# Patient Record
Sex: Male | Born: 1940 | Race: White | Hispanic: No | Marital: Married
Health system: Midwestern US, Community
[De-identification: ages and names within clinical notes are randomized; demographics above are authoritative.]

## PROBLEM LIST (undated history)

## (undated) DIAGNOSIS — N281 Cyst of kidney, acquired: Principal | ICD-10-CM

## (undated) DIAGNOSIS — Z8546 Personal history of malignant neoplasm of prostate: Secondary | ICD-10-CM

## (undated) DIAGNOSIS — R002 Palpitations: Principal | ICD-10-CM

---

## 2019-01-21 IMAGING — OT DXA BONE DENSITY (DR. [PERSON_NAME] PROTOCOL)
2 series · 2 of 2 positions shown · non-contrast
Comparison: none

REASON FOR EXAM: Osteoporosis screening

RISK FACTORS:  Hormone deprivation therapy for prostate cancer.
PRIOR EXAMS:  None
METHOD:  Scans of the spine and hip were performed using dual energy X-ray densitometry (DXA) with the Hologic Discovery C ([id number]) system.

[Series 1: — · 1 of 1 slices shown (1 of 2)]
[im 1/1]
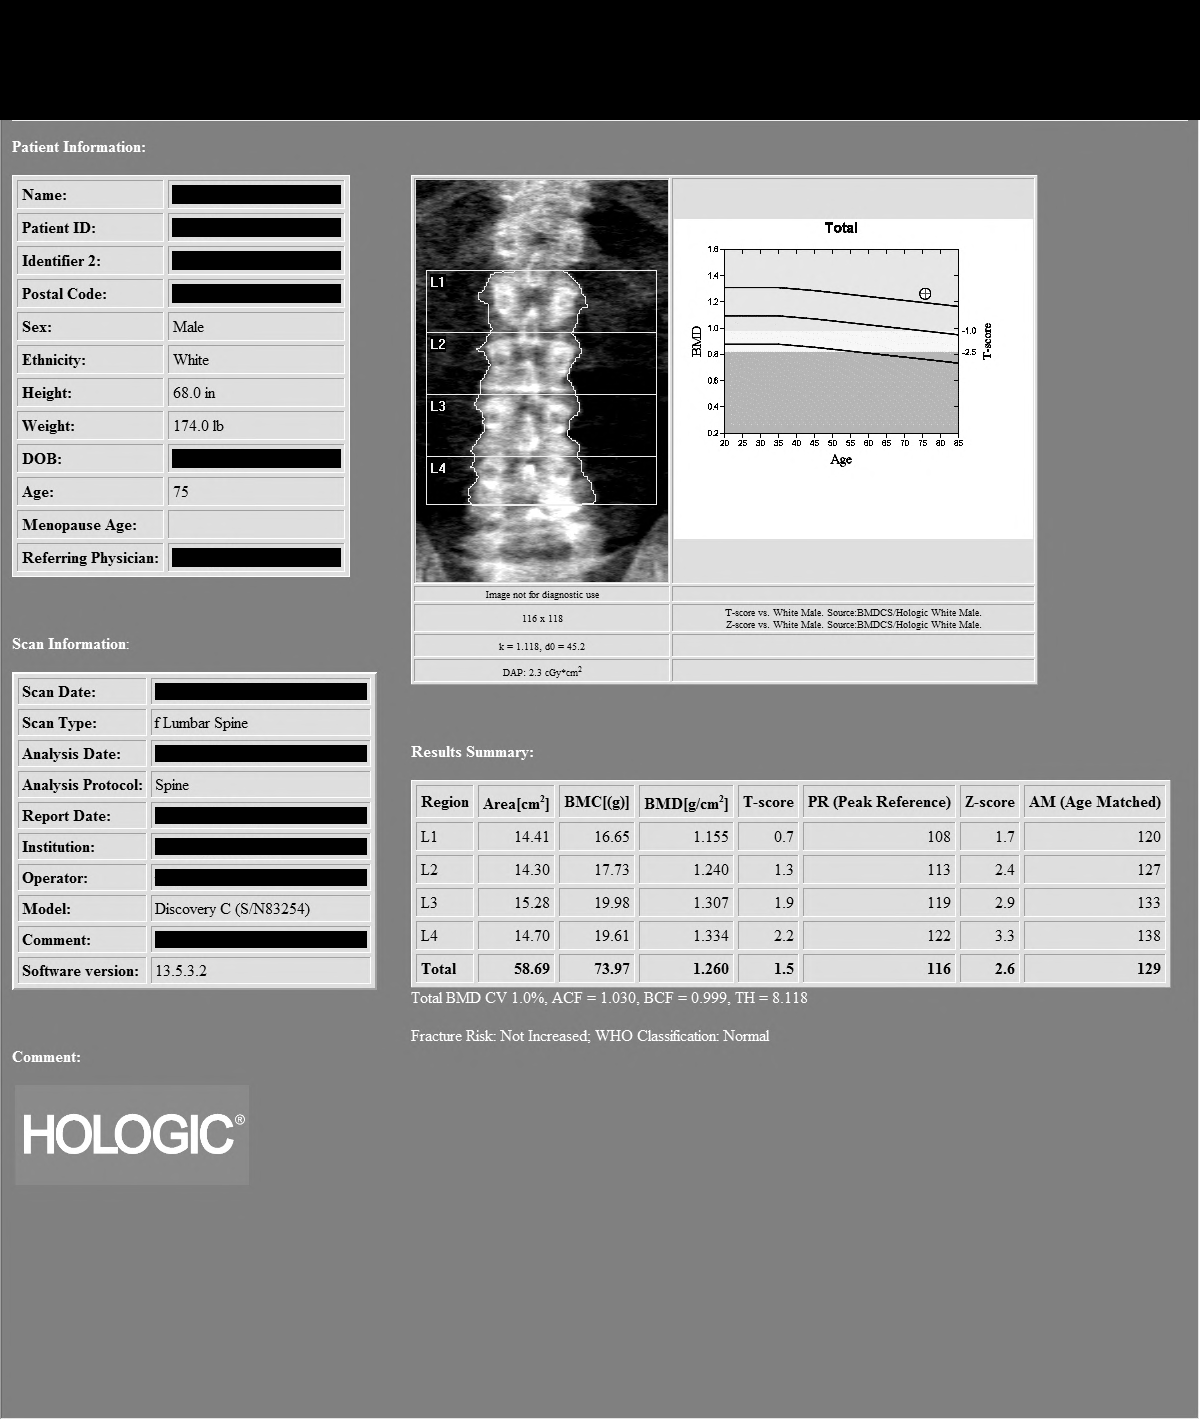

[Series 2: — · left · 1 of 1 slices shown (2 of 2)]
[im 1/1]
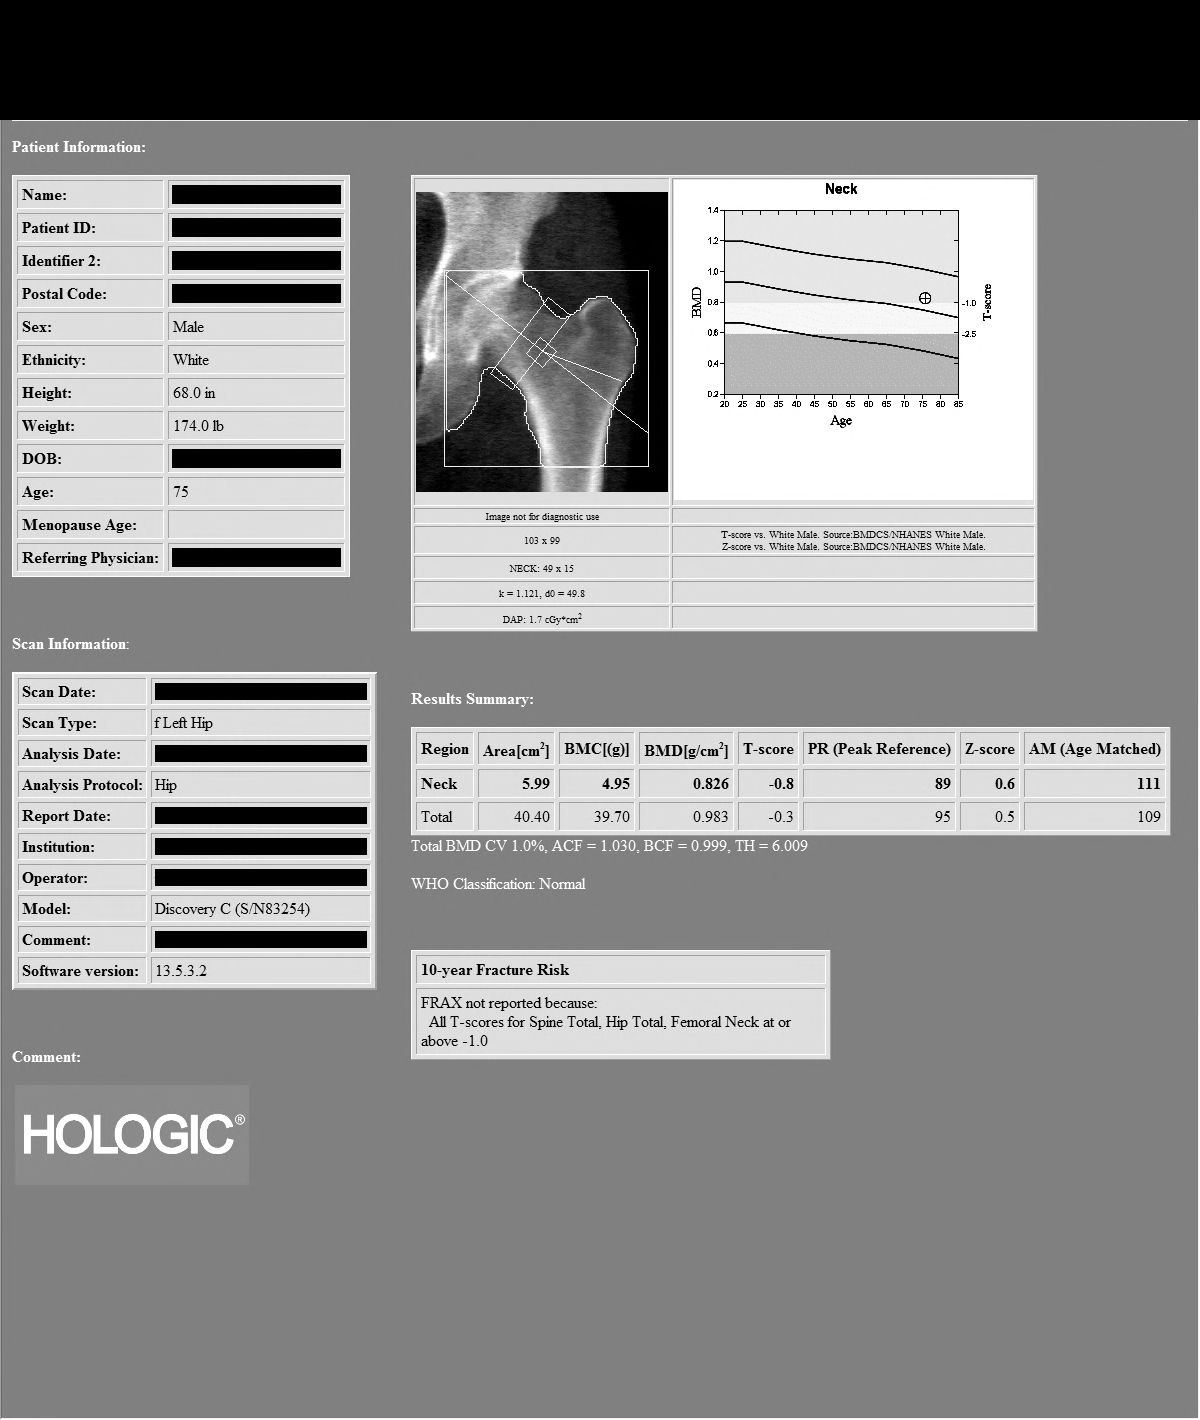

[2 of 2 positions shown; findings below may reference images not displayed]

IMPRESSION: As defined by World Health Organization, the patient meets the criteria for NORMAL BONE DENSITY based on both spine and hip T-scores.

PATIENT DEMOGRAPHICS:  77-year-old white male.
FINDINGS: 1.    Review of scanogram images shows prominent spinal arthritis. No vertebra is excluded from spine analysis.  

2.    The lumbar spine exam using L1-L4 regions shows average Bone Mineral Density is 1.260 gm/cm2 of Hydroxyapatite.  The T-score (comparing patient with a young adult group) is 1.5 standard deviations ABOVE mean. The Z-score (comparing patient with an age-matched group) is 2.6 standard deviations ABOVE mean.

3.  The left hip exam using femoral neck region of interest shows average Bone Mineral Density is 0.826 gm/cm2 of Hydroxyapatite. The T-score (comparing patient with a young adult group) is 0.8 standard deviations BELOW mean. The Z-score (comparing patient with an age-matched group) is 0.6 standard deviations ABOVE mean.

RECOMMENDATIONS:  The patient states that he is taking calcium supplements on a regular basis.  The patient should continue being a nonsmoker and regular exercise to patient tolerance would be of benefit.  The patient is currently not taking prescribed medication for prevention of bone loss. .

## 2020-04-24 LAB — PSA, DIAGNOSTIC (PROSTATE SPECIFIC AG): Prostate Specific Ag: <0.014 ng/mL (ref 0.0–4.400)

## 2020-04-24 LAB — PSA PROSTATIC SPECIFIC ANTIGEN: PSA: <0.014 ng/mL (ref 0.0–4.400)

## 2020-04-28 NOTE — Telephone Encounter (Signed)
Called the referring physician's office, [phone], Left a message with Selena Batten the referral coordinator and requested records for patient's upcoming visit.

## 2020-05-02 ENCOUNTER — Ambulatory Visit: Attending: Physician Assistant | Primary: Emergency Medicine

## 2020-05-02 ENCOUNTER — Ambulatory Visit: Admit: 2020-05-02 | Discharge: 2020-05-02 | Attending: Physician Assistant | Primary: Emergency Medicine

## 2020-05-02 DIAGNOSIS — Z8546 Personal history of malignant neoplasm of prostate: Secondary | ICD-10-CM

## 2020-05-02 NOTE — Progress Notes (Signed)
Progress Notes by Aliene Altes, PA-C at 05/02/20 1020                Author: Aliene Altes, PA-C  Service: --  Author Type: Physician Assistant       Filed: 05/02/20 1715  Encounter Date: 05/02/2020  Status: Signed          Editor: Aliene Altes, PA-C (Physician Assistant)                    John Chaney   DOB 1941/01/17   Encounter date: 05/02/2020            ASSESSMENT:      Encounter Diagnoses              ICD-10-CM  ICD-9-CM          1.  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  Pretreatment PSA of 67.3.      Treated with IMRT + 2 years of ADT.  Completed IMRT 04/20/2018.    Most recent PSA  <0.014 ng/mL on 04/24/20, previously <0.1 on 10/20/2019.    Treated in [place], [place].  Patient recently moved to our area and is here to establish care.      -ECOG 0      PLAN:    ??  Reviewed recent PSA of <0.014 ng/mL on 04/24/20 with patient    ??  Will refer to [facility]-patient follows with nephrology he was in [place].  Most recent creatinine 1.7 on 04/24/2020   ??  We will request records from [facility] urology group so that we may have a pathology report to review.  Only available records at this time are patient's last office note from 10/2019.   ??  Given PSA undetectable recently, will plan to follow-up in 6 months with another PSA prior.                 Chief Complaint       Patient presents with        ?  Prostate Cancer           HISTORY OF PRESENT ILLNESS:  John Chaney  is a 80 y.o. male who is seen  in consultation as referred by Dr. Elsie Lincoln, MD for history of prostate cancer.      Patient presents today doing well.  He has a history of prostate cancer and was treated in [facility], however he would like to follow-up here for care at this time.  He states that he still intermittently has mild hot flashes. In regards to  voiding, he reports no significant complaints. No significant urgency,  frequency, hesitancy, intermittency, post-void dribbling, or incontinence. Denies any gross hematuria or dysuria. Asymptomatic for UTI. No f/c/n/v.  Otherwise no complaints at this  time.         No flowsheet data found.           Past Medical History:        Diagnosis  Date         ?  Atrial fibrillation (HCC)       ?  Coronary artery disease involving native coronary artery of native heart without angina pectoris       ?  Dyslipidemia       ?  Primary hypertension       ?  Prostate cancer (HCC)           ?  S/P CABG (coronary artery bypass graft)             History reviewed. No pertinent surgical history.        Social History          Tobacco Use         ?  Smoking status:  Never Smoker     ?  Smokeless tobacco:  Never Used       Substance Use Topics         ?  Alcohol use:  Not on file         ?  Drug use:  Not on file           No Known Allergies      History reviewed. No pertinent family history.            Review of Systems   Constitutional: Fever: No   Skin: Rash: No   HEENT: Hearing difficulty: No   Eyes: Blurred vision: No   Cardiovascular: Chest pain: No   Respiratory: Shortness of breath: No   Gastrointestinal: Nausea/vomiting: No   Musculoskeletal: Back pain: No   Neurological: Weakness: No   Psychological: Memory loss: No   Comments/additional findings:             PHYSICAL EXAMINATION:    Visit Vitals      Ht  5\' 9"  (1.753 m)     Wt  180 lb (81.6 kg)        BMI  26.58 kg/m??        Constitutional: WDWN, Pleasant and appropriate affect, No acute distress.     CV:  No peripheral swelling noted   Respiratory: No respiratory distress or difficulties   Abdomen: Nondistended   Skin: No jaundice.     Neuro/Psych:  Alert and oriented x 3, affect appropriate.    MSK: Full range of motion         REVIEW OF LABS AND IMAGING:       Results for orders placed or performed in visit on 04/28/20     PSA, DIAGNOSTIC (PROSTATE SPECIFIC AG)         Result  Value  Ref Range            Prostate Specific Ag  <0.014  0.0 -  4.400 ng/mL                Lab Results         Component  Value  Date/Time            Prostate Specific Ag  <0.014  04/24/2020 12:00 AM              A copy of today's office visit with all pertinent imaging results and labs were sent to the referring physician.        04/26/2020 PA-C   [facility]      Patient's BMI is out of the normal parameters.  Information about BMI was given and patient was advised to follow-up with their PCP for further management.

## 2020-10-25 ENCOUNTER — Encounter: Admit: 2020-10-25 | Discharge: 2020-10-25 | Primary: Emergency Medicine

## 2020-10-25 DIAGNOSIS — Z8546 Personal history of malignant neoplasm of prostate: Secondary | ICD-10-CM

## 2020-10-25 NOTE — Progress Notes (Signed)
John Chaney presents today for lab draw per Aliene Altes order.   Dr. Esmeralda Arthur was present in the clinic as incident to.     PSA obtained via venipuncture without any difficulty.    Patient will be notified with lab results.       Orders Placed This Encounter    PROSTATE SPECIFIC AG    COLLECTION VENOUS [name]

## 2020-10-26 LAB — PSA, DIAGNOSTIC (PROSTATE SPECIFIC AG): Prostate Specific Ag: <0.1 ng/mL (ref 0.0–4.0)

## 2020-10-26 LAB — PSA PROSTATIC SPECIFIC ANTIGEN: PSA: <0.1 ng/mL (ref 0.0–4.0)

## 2020-10-30 ENCOUNTER — Encounter: Primary: Emergency Medicine

## 2020-11-06 ENCOUNTER — Ambulatory Visit: Attending: Physician Assistant | Primary: Emergency Medicine

## 2020-11-06 ENCOUNTER — Encounter
Admit: 2020-11-06 | Discharge: 2020-11-06 | Payer: MEDICARE | Attending: Physician Assistant | Primary: Emergency Medicine

## 2020-11-06 DIAGNOSIS — Z8546 Personal history of malignant neoplasm of prostate: Secondary | ICD-10-CM

## 2020-11-06 NOTE — Progress Notes (Signed)
Progress Notes by Aliene Altes, PA-C at 11/06/20 0900                Author: Aliene Altes, PA-C  Service: --  Author Type: Physician Assistant       Filed: 11/06/20 0905  Encounter Date: 11/06/2020  Status: Signed          Editor: Aliene Altes, PA-C (Physician Assistant)                    John Chaney   DOB Jul 11, 1940   Encounter date: 11/06/2020            ASSESSMENT:      Encounter Diagnoses              ICD-10-CM  ICD-9-CM          1.  History of prostate cancer   Z85.46  V10.46              - cT2cN0M0 Gleason 4+5 = 9 prostate cancer.  Pretreatment PSA of 67.3.      Treated with IMRT + 2 years of ADT.  Completed IMRT 04/20/2018.    Treated in [place], [place].  Patient recently moved to our area and is here to establish care.    Most recent PSA <0.1ng/ml on 10/25/20.       -ECOG 0      PLAN:    ??  Reviewed most recent PSA <0.1ng/ml on 10/25/20.    ??  Follow-up with Nephrology as scheduled.   ??  We discussed that testosterone can take several months to replete after discontinuation of ADT.  Do not recommend TRT at this time.   ??  Follow-up in 6 months with PSA, T prior.                  Chief Complaint       Patient presents with        ?  Prostate Cancer              HISTORY OF PRESENT ILLNESS:  John Chaney  is a 80 y.o. male who is seen  in follow-up of prostate cancer.      Patient presents today doing well.  He offers no significant complaints today.  States he has a follow-up scheduled with nephrology on October 6.  Denies any episodes of gross hematuria, dysuria.  Does complain of bothersome urinary urgency during the  day.  States he urinates about 3 times during the day.  Nocturia x1-2.  Not interested in medication at this time.  Otherwise no new complaints.  Concerned about having low testosterone.  Denies any hot flashes or significant fatigue.         No flowsheet data found.           Past Medical History:        Diagnosis  Date         ?  Atrial fibrillation (HCC)       ?   Coronary artery disease involving native coronary artery of native heart without angina pectoris       ?  Dyslipidemia       ?  Primary hypertension       ?  Prostate cancer (HCC)           ?  S/P CABG (coronary artery bypass graft)             History reviewed. No pertinent surgical history.  Social History          Tobacco Use         ?  Smoking status:  Never         ?  Smokeless tobacco:  Never           No Known Allergies      History reviewed. No pertinent family history.            Review of Systems   Constitutional: Fever: No   Skin: Rash: No   HEENT: Hearing difficulty: No   Eyes: Blurred vision: No   Cardiovascular: Chest pain: No   Respiratory: Shortness of breath: No   Gastrointestinal: Nausea/vomiting: No   Musculoskeletal: Back pain: No   Neurological: Weakness: No   Psychological: Memory loss: No   Comments/additional findings:             PHYSICAL EXAMINATION:    Visit Vitals      Ht  5\' 9"  (1.753 m)     Wt  185 lb (83.9 kg)        BMI  27.32 kg/m??           Constitutional: WDWN, Pleasant and appropriate affect, No acute distress.     CV:  No peripheral swelling noted   Respiratory: No respiratory distress or difficulties   Abdomen: Nondistended   Skin: No jaundice.     Neuro/Psych:  Alert and oriented x 3, affect appropriate.    MSK: Full range of motion         REVIEW OF LABS AND IMAGING:       Results for orders placed or performed in visit on 10/25/20     PSA, DIAGNOSTIC (PROSTATE SPECIFIC AG)         Result  Value  Ref Range            Prostate Specific Ag  <0.1  0.0 - 4.0 ng/mL                Lab Results         Component  Value  Date/Time            Prostate Specific Ag  <0.1  10/25/2020 12:58 PM            Prostate Specific Ag  <0.014  04/24/2020 12:00 AM              A copy of today's office visit with all pertinent imaging results and labs were sent to the referring physician.        04/26/2020 PA-C   [facility]      Patient's BMI is out of the normal parameters.   Information about BMI was given and patient was advised to follow-up with their PCP for further management.

## 2021-04-25 NOTE — Telephone Encounter (Signed)
Patient called- he is scheduled for 6 mos follow up on 05/07/21 at 8:40 am.  He is suppose to have blood work prior.  Please send order to [facility] or mail copy to his address on file.    His tel: [phone]

## 2021-04-26 ENCOUNTER — Encounter

## 2021-04-26 NOTE — Telephone Encounter (Signed)
PSA, T orders mailed to pt address.

## 2021-05-04 LAB — TESTOSTERONE, TOTAL, ADULT MALE: TESTOSTERONE, TOTAL, MALES (ADULT): 385 ng/dL (ref 250–827)

## 2021-05-04 LAB — PSA, DIAGNOSTIC (PROSTATE SPECIFIC AG): PSA Total: 0.14 ng/mL (ref <=4.00)

## 2021-05-04 LAB — PSA PROSTATIC SPECIFIC ANTIGEN: PSA, TOTAL: 0.14 ng/mL (ref <=4.00)

## 2021-05-04 LAB — TESTOSTERONE: TESTOSTERONE, TOTAL, MALES (ADULT): 385 ng/dL (ref 250–827)

## 2021-05-07 ENCOUNTER — Ambulatory Visit
Admit: 2021-05-07 | Discharge: 2021-05-07 | Payer: MEDICARE | Attending: Physician Assistant | Primary: Emergency Medicine

## 2021-05-07 ENCOUNTER — Encounter: Attending: Physician Assistant | Primary: Emergency Medicine

## 2021-05-07 DIAGNOSIS — Z8546 Personal history of malignant neoplasm of prostate: Secondary | ICD-10-CM

## 2021-05-07 NOTE — Progress Notes (Signed)
ASSESSMENT:    Diagnosis Orders   1. Personal history of malignant neoplasm of prostate  PSA, Diagnostic    PSA, Diagnostic    TESTOSTERONE    TESTOSTERONE            - cT2cN0M0 Gleason 4+5 = 9 prostate cancer.  Pretreatment PSA of 67.3.      Treated with IMRT + 2 years of ADT.  Completed IMRT 04/20/2018.    Treated in [place], [place].  Patient recently moved to our area and is here to establish care.    Most recent PSA 0.14ng/ml on 05/03/21.       -ECOG 0    - CKD    Follows with Nephrology.       PLAN:   Reviewed most recent PSA 0.14ng/ml, T 385 on 05/03/21.   Will continue to monitor.   Labs orders given for patient to have PSA, T drawn in 3 months through [facility].  Follow-up in 6 months with another PSA, T prior.      Dr. Silvestre Moment is the supervising physician today        Chief Complaint   Patient presents with    Prostate Cancer       HISTORY OF PRESENT ILLNESS:  John Chaney is a 81 y.o. male who is seen  in follow-up of prostate cancer.      Patient presents today doing well. No new complaints. No gross hematuria or hematochezia.           Past Medical History:   Diagnosis Date    Atrial fibrillation (HCC)     Coronary artery disease involving native coronary artery of native heart without angina pectoris     Dyslipidemia     Primary hypertension     Prostate cancer (HCC)     S/P CABG (coronary artery bypass graft)        No past surgical history on file.    Social History     Tobacco Use    Smoking status: Never    Smokeless tobacco: Never       No Known Allergies    No family history on file.    Current Outpatient Medications   Medication Sig Dispense Refill    albuterol sulfate HFA (PROVENTIL;VENTOLIN;PROAIR) 108 (90 Base) MCG/ACT inhaler INHALE 2 PUFFS BY MOUTH EVERY 4-6 HOURS AS NEEDED FOR WHEEZING      aspirin 81 MG EC tablet Take by mouth daily      atorvastatin (LIPITOR) 80 MG tablet 1 tablet      benzonatate (TESSALON) 200 MG capsule TAKE 1 CAPSULE BY MOUTH 3 TIMES A DAY AS NEEDED FOR COUGH       losartan (COZAAR) 100 MG tablet 1 tablet      metoprolol succinate (TOPROL XL) 50 MG extended release tablet Take 50 mg by mouth 2 times daily       No current facility-administered medications for this visit.         PHYSICAL EXAMINATION:   Ht 5\' 9"  (1.753 m)    BMI 27.32 kg/m??   Constitutional: WDWN, Pleasant and appropriate affect, No acute distress.    CV:  No peripheral swelling noted  Respiratory: No respiratory distress or difficulties  Abdomen: Non-distended  Skin: No jaundice.    Neuro/Psych:  Alert and oriented x 3, affect appropriate.   Msk: FROM          REVIEW OF LABS AND IMAGING:      No results found for this  visit on 05/07/21.    Aliene Altes PA-C  [facility]

## 2021-08-09 LAB — HEMOGLOBIN A1C
Estimated Avg Glucose, External: 133 mg/dL — ABNORMAL HIGH (ref 91–123)
Hemoglobin A1C, External: 6.3 % — ABNORMAL HIGH (ref 4.8–5.6)

## 2021-11-09 ENCOUNTER — Encounter
Admit: 2021-11-09 | Discharge: 2021-11-09 | Payer: MEDICARE | Attending: Physician Assistant | Primary: Emergency Medicine

## 2021-11-09 DIAGNOSIS — Z8546 Personal history of malignant neoplasm of prostate: Secondary | ICD-10-CM

## 2021-11-09 NOTE — Progress Notes (Signed)
ASSESSMENT:     ICD-10-CM    1. Personal history of malignant neoplasm of prostate  Z85.46 PSA, Diagnostic     TESTOSTERONE             - cT2cN0M0 Gleason 4+5 = 9 prostate cancer.  Pretreatment PSA of 67.3.      Treated with IMRT + 2 years of ADT.  Completed IMRT 04/20/2018.    Treated in [place], [place].  Patient recently moved to our area and is here to establish care.    Most recent PSA 0.29ng/ml on 10/25/21 (T 471), prior was 0.14ng/ml  (T 385) on 05/03/21.       -ECOG 0    - CKD    Follows with Nephrology.     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       PLAN:   Reviewed most recent PSA 0.29ng/ml on 10/25/21 (T 471)  Reviewed options for ED to include oral medications (as well as option for combination daily Cialis with Viagra booster).  Patient would like to consider Cialis or Viagra and inform our clinic if he would like to trial.   Follow-up in 6 months with PSA, T prior. Printed lab orders given to draw closer to home.       Dr. Claiborne Billings is the supervising physician today      Chief Complaint   Patient presents with    Prostate Cancer       HISTORY OF PRESENT ILLNESS:  John Chaney is a 81 y.o. male who is seen  in follow-up of prostate cancer.      Patient presents today doing well. He notes issues with ED since his radiation therapy and would like to consider therapy for this. Otherwise no complaints.           Past Medical History:   Diagnosis Date    Atrial fibrillation ([place])     Coronary artery disease involving native coronary artery of native heart without angina pectoris     Dyslipidemia     Primary hypertension     Prostate cancer (HCC)     S/P CABG (coronary artery bypass graft)        History reviewed. No pertinent surgical history.    Social History     Tobacco Use    Smoking status: Never    Smokeless tobacco: Never   Substance Use Topics    Alcohol use: Yes    Drug use: Defer       No Known Allergies    History reviewed. No pertinent family history.    Current Outpatient Medications    Medication Sig Dispense Refill    RESTASIS 0.05 % ophthalmic emulsion       albuterol sulfate HFA (PROVENTIL;VENTOLIN;PROAIR) 108 (90 Base) MCG/ACT inhaler INHALE 2 PUFFS BY MOUTH EVERY 4-6 HOURS AS NEEDED FOR WHEEZING      aspirin 81 MG EC tablet Take by mouth daily      atorvastatin (LIPITOR) 80 MG tablet 1 tablet      losartan (COZAAR) 100 MG tablet 1 tablet      metoprolol succinate (TOPROL XL) 50 MG extended release tablet Take 1 tablet by mouth 2 times daily      NIFEdipine (ADALAT CC) 30 MG extended release tablet TAKE 1 TABLET BY MOUTH EVERY DAY ON EMPTY STOMACH FOR 30 DAYS Oral for 90 Days       No current facility-administered medications for this visit.         PHYSICAL EXAMINATION:  Ht 5\' 9"  (1.753 m)   Wt 179 lb (81.2 kg)   BMI 26.43 kg/m   Constitutional: WDWN, Pleasant and appropriate affect, No acute distress.    CV:  No peripheral swelling noted  Respiratory: No respiratory distress or difficulties  Abdomen: Non-distended  Skin: No jaundice.    Neuro/Psych:  Alert and oriented x 3, affect appropriate.   Msk: FROM          REVIEW OF LABS AND IMAGING:      No results found for this visit on 11/09/21.    11/11/21 PA-C  [facility]

## 2022-02-26 ENCOUNTER — Encounter
Admit: 2022-02-26 | Discharge: 2022-02-26 | Payer: PRIVATE HEALTH INSURANCE | Attending: Cardiovascular Disease | Primary: Family

## 2022-02-26 DIAGNOSIS — R0602 Shortness of breath: Secondary | ICD-10-CM

## 2022-02-26 NOTE — Progress Notes (Signed)
John Chaney (DOB:  1940/09/28) is a 82 y.o. male,Established patient, here for evaluation of the following chief complaint(s):  Follow-up (7 Month F/U)    Subjective   SUBJECTIVE/OBJECTIVE:  HPI  Patient presents today for follow-up. He has a history of coronary artery disease and is status post bypass surgery in 2013. He relocated here approximately a year ago. Patient was having shortness of breath at the time I originally saw him, but told me that it improved over time. Since that time, he states it has worsened again. He states that going up steps or any significant physical activity causes excess shortness of breath. No palpitations. No chest discomfort. He does have hypertension and hyperlipidemia, but no other acute issues at this time. He is a nonsmoker. He has been tested for COPD which was negative previously.           I have carefully reviewed all available medical records, previous office notes, lab, x-ray, and procedure reports.    Past Medical History:   Diagnosis Date    Atrial fibrillation ([place])     Coronary artery disease involving native coronary artery of native heart without angina pectoris     Dyslipidemia     Primary hypertension     Prostate cancer (HCC)     S/P CABG (coronary artery bypass graft)         History reviewed. No pertinent surgical history.     No Known Allergies     Current Outpatient Medications   Medication Sig Dispense Refill    TYRVAYA 0.03 MG/ACT SOLN 0.03 mg daily      Omega-3 Fatty Acids (FISH OIL) 500 MG CAPS 500 mg daily      Calcium Carb-Cholecalciferol (CALCIUM 1000 + D) 1000-20 MG-MCG TABS daily      NIFEdipine (ADALAT CC) 30 MG extended release tablet TAKE 1 TABLET BY MOUTH EVERY DAY ON EMPTY STOMACH FOR 30 DAYS Oral for 90 Days      aspirin 81 MG EC tablet Take by mouth daily      atorvastatin (LIPITOR) 80 MG tablet 1 tablet      losartan (COZAAR) 100 MG tablet 1 tablet      metoprolol succinate (TOPROL XL) 50 MG extended release tablet Take 1 tablet by mouth  2 times daily       No current facility-administered medications for this visit.        Social History     Tobacco Use    Smoking status: Never    Smokeless tobacco: Never   Vaping Use    Vaping Use: Never used   Substance Use Topics    Alcohol use: Yes     Alcohol/week: 1.0 standard drink of alcohol     Types: 1 Glasses of wine per week    Drug use: Never        Family History   Problem Relation Age of Onset    Heart Attack Mother     Heart Attack Father     Heart Disease Brother         Review of Systems   Constitutional: Negative.    HENT: Negative.     Eyes: Negative.    Respiratory:  Positive for shortness of breath.    Cardiovascular:  Positive for chest pain and palpitations.   Gastrointestinal: Negative.    Endocrine: Negative.    Genitourinary: Negative.    Allergic/Immunologic: Negative.    Neurological: Negative.    Hematological: Negative.    Psychiatric/Behavioral: Negative.  Physical Exam  Vitals and nursing note reviewed.   Constitutional:       Appearance: Normal appearance.   HENT:      Head: Normocephalic and atraumatic.      Right Ear: External ear normal.      Left Ear: External ear normal.      Nose: Nose normal.      Mouth/Throat:      Mouth: Mucous membranes are dry.      Pharynx: Oropharynx is clear.   Eyes:      Extraocular Movements: Extraocular movements intact.      Conjunctiva/sclera: Conjunctivae normal.      Pupils: Pupils are equal, round, and reactive to light.   Neck:      Thyroid: No thyroid mass.      Vascular: No carotid bruit or JVD.      Trachea: Trachea normal.   Cardiovascular:      Rate and Rhythm: Normal rate and regular rhythm.      Pulses:           Carotid pulses are 2+ on the right side and 2+ on the left side.       Radial pulses are 2+ on the right side and 2+ on the left side.        Femoral pulses are 2+ on the right side and 2+ on the left side.       Popliteal pulses are 2+ on the right side and 2+ on the left side.        Dorsalis pedis pulses are 2+ on the  right side and 2+ on the left side.        Posterior tibial pulses are 2+ on the right side and 2+ on the left side.      Heart sounds: S1 normal and S2 normal. Murmur heard.      Decrescendo systolic murmur is present with a grade of 1/6.      Gallop present. S4 sounds present.   Pulmonary:      Effort: Pulmonary effort is normal.      Breath sounds: Normal breath sounds.   Abdominal:      General: Abdomen is flat. Bowel sounds are normal.      Palpations: Abdomen is soft.   Musculoskeletal:         General: Normal range of motion.      Cervical back: Normal range of motion and neck supple.      Right lower leg: No edema.      Left lower leg: No edema.   Lymphadenopathy:      Cervical: No cervical adenopathy.   Skin:     General: Skin is warm and dry.      Capillary Refill: Capillary refill takes 2 to 3 seconds.   Neurological:      General: No focal deficit present.      Mental Status: He is alert and oriented to person, place, and time.   Psychiatric:         Mood and Affect: Mood normal.       ASSESSMENT/PLAN:  1. Exertional shortness of breath  -     Echo (TTE) complete (PRN contrast/bubble/strain/3D); Future  2. Hypertensive heart disease with chronic diastolic congestive heart failure ([place])  3. Chronic diastolic (congestive) heart failure (HCC)  -     Echo (TTE) complete (PRN contrast/bubble/strain/3D); Future  4. Coronary artery disease involving coronary bypass graft of native heart without angina pectoris  5. Abnormal  ECG  6. Systolic murmur  7. Hypercholesteremia  8. Occlusion of right carotid artery  9. Mitral valve disease  -     Echo (TTE) complete (PRN contrast/bubble/strain/3D); Future  10. Pulmonary hypertension (HCC)  -     Echo (TTE) complete (PRN contrast/bubble/strain/3D); Future    His degree of shortness of breath has improved.  It occurs mostly with walking up steps.  His last echocardiogram shows normal heart function but with grade 2 diastolic dysfunction and evidence of pulm hypertension  with pulmonary pressures of 52 mmHg.  Patient states at present he can exercise and carry on daily activities without restriction.  Only going up steps may elicit shortness of breath if he is carrying heavy objects.  I would not recommend any additional treatment at this time but more conservative management, dietary restraint and sodium restriction    No results found for any visits on 02/26/22.     Return in about 7 months (around 09/27/2022) for Follow up with echo.            An electronic signature was used to authenticate this note.    --Gurney Maxin, MD

## 2022-05-09 ENCOUNTER — Ambulatory Visit: Admit: 2022-05-09 | Discharge: 2022-05-09 | Payer: MEDICARE | Attending: Physician Assistant | Primary: Family

## 2022-05-09 DIAGNOSIS — Z8546 Personal history of malignant neoplasm of prostate: Secondary | ICD-10-CM

## 2022-05-09 MED ORDER — SILDENAFIL CITRATE 100 MG PO TABS
100 | ORAL_TABLET | ORAL | 3 refills | Status: DC | PRN
Start: 2022-05-09 — End: 2023-09-25

## 2022-05-09 NOTE — Progress Notes (Signed)
ASSESSMENT:     ICD-10-CM    1. Personal history of malignant neoplasm of prostate  Z85.46 PSA, Diagnostic     Testosterone      2. 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  Pretreatment PSA of 67.3.      Treated with IMRT + 2 years of ADT.  Completed IMRT 04/20/2018.    Treated in [place], [place].  Patient recently moved to our area and is here to establish care.    Most recent PSA 0.29ng/ml on 10/25/21 (T 471), prior was 0.14ng/ml  (T 385) on 05/03/21.       -ECOG 0    - CKD    Follows with Nephrology.     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      PLAN:   Reviewed most recent labs 05/01/22: PSA 0.34ng/ml, T 494  Patient would like to trial medication for ED - Viagra 100 mg given with printed RX. R/b reviewed, no NTG rx. Recommend patient discuss medication with his cardiologist prior to taking and he voiced understanding.  Good rx card given.   RTC in 6 months with PSA, T prior. Lab orders given.       Dr. Jimmye Norman is the supervising physician today        Chief Complaint   Patient presents with    Prostate Cancer       HISTORY OF PRESENT ILLNESS:  John Chaney is a 82 y.o. male who is seen  in follow-up of prostate cancer.      Patient presents today doing well. He notes issues with ED since his radiation therapy - interested in therapy again today. Otherwise no complaints. No voiding issues.           Past Medical History:   Diagnosis Date    Atrial fibrillation ([place])     Coronary artery disease involving native coronary artery of native heart without angina pectoris     Dyslipidemia     Primary hypertension     Prostate cancer (HCC)     S/P CABG (coronary artery bypass graft)        History reviewed. No pertinent surgical history.    Social History     Tobacco Use    Smoking status: Never    Smokeless tobacco: Never   Vaping Use    Vaping Use: Never used   Substance Use Topics    Alcohol use: Yes     Alcohol/week: 1.0 standard drink  of alcohol     Types: 1 Glasses of wine per week    Drug use: Never       No Known Allergies    Family History   Problem Relation Age of Onset    Heart Attack Mother     Heart Attack Father     Heart Disease Brother        Current Outpatient Medications   Medication Sig Dispense Refill    sildenafil (VIAGRA) 100 MG tablet Take 1 tablet by mouth as needed for Erectile Dysfunction 30 tablet 3    TYRVAYA 0.03 MG/ACT SOLN 0.03 mg daily      Omega-3 Fatty Acids (FISH OIL) 500 MG CAPS 500 mg daily      Calcium Carb-Cholecalciferol (CALCIUM 1000 + D) 1000-20 MG-MCG TABS daily      NIFEdipine (ADALAT CC) 30 MG extended release tablet  TAKE 1 TABLET BY MOUTH EVERY DAY ON EMPTY STOMACH FOR 30 DAYS Oral for 90 Days      aspirin 81 MG EC tablet Take by mouth daily      atorvastatin (LIPITOR) 80 MG tablet 1 tablet      losartan (COZAAR) 100 MG tablet 1 tablet      metoprolol succinate (TOPROL XL) 50 MG extended release tablet Take 1 tablet by mouth 2 times daily       No current facility-administered medications for this visit.         PHYSICAL EXAMINATION:   Ht 1.753 m (5\' 9" )   Wt 81.6 kg (180 lb)   BMI 26.58 kg/m   Constitutional: WDWN, Pleasant and appropriate affect, No acute distress.    CV:  No peripheral swelling noted  Respiratory: No respiratory distress or difficulties  Abdomen: Non-distended  Skin: No jaundice.    Neuro/Psych:  Alert and oriented x 3, affect appropriate.   Msk: FROM          REVIEW OF LABS AND IMAGING:      No results found for this visit on 05/09/22.    Phillis Knack PA-C  [facility]

## 2022-10-02 ENCOUNTER — Ambulatory Visit: Admit: 2022-10-02 | Discharge: 2022-10-02 | Payer: PRIVATE HEALTH INSURANCE | Primary: Family

## 2022-10-02 ENCOUNTER — Ambulatory Visit: Payer: PRIVATE HEALTH INSURANCE | Primary: Family

## 2022-10-02 ENCOUNTER — Encounter: Payer: PRIVATE HEALTH INSURANCE | Primary: Family

## 2022-10-02 ENCOUNTER — Encounter
Admit: 2022-10-02 | Discharge: 2022-10-02 | Payer: PRIVATE HEALTH INSURANCE | Attending: Cardiovascular Disease | Primary: Family

## 2022-10-02 DIAGNOSIS — R600 Localized edema: Secondary | ICD-10-CM

## 2022-10-02 DIAGNOSIS — R0602 Shortness of breath: Secondary | ICD-10-CM

## 2022-10-02 NOTE — Progress Notes (Signed)
1. "Have you been to the ER, urgent care clinic since your last visit?  Hospitalized since your last visit?" Reviewed by Dr. Keith Newby    2. "Have you seen or consulted any other health care providers outside of the [facility] since your last visit?" Reviewed by Dr. Keith Newby

## 2022-10-02 NOTE — Progress Notes (Signed)
John Chaney (DOB:  1941/02/09) is a 82 y.o. male,Established patient, here for evaluation of the following chief complaint(s):  Follow-up (42month w/ ECHO)    Subjective   SUBJECTIVE/OBJECTIVE:  HPI  Patient presents today for follow-up. He has a history of coronary artery disease and is status post bypass surgery in 2013. He relocated here approximately a year ago. Patient was having shortness of breath at the time I originally saw him, but told me that it improved over time. Since that time, he states it has worsened again. He states that going up steps or any significant physical activity causes excess shortness of breath. No palpitations. No chest discomfort. He does have hypertension and hyperlipidemia, but no other acute issues at this time. He is a nonsmoker. He has been tested for COPD which was negative previously.  He does have left leg swelling and needs further definition.  He had a syncopal episode a few months ago and was hospitalized briefly with a negative cardiac workup.  Blood pressures are poorly controlled today.    I have carefully reviewed all available medical records, previous office notes, lab, x-ray, and procedure reports.    Past Medical History:   Diagnosis Date    Atrial fibrillation (HCC)     Coronary artery disease involving native coronary artery of native heart without angina pectoris     Dyslipidemia     Primary hypertension     Prostate cancer (HCC)     S/P CABG (coronary artery bypass graft)         History reviewed. No pertinent surgical history.     No Known Allergies     Current Outpatient Medications   Medication Sig Dispense Refill    sildenafil (VIAGRA) 100 MG tablet Take 1 tablet by mouth as needed for Erectile Dysfunction 30 tablet 3    Omega-3 Fatty Acids (FISH OIL) 500 MG CAPS 500 mg daily      Calcium Carb-Cholecalciferol (CALCIUM 1000 + D) 1000-20 MG-MCG TABS daily      NIFEdipine (ADALAT CC) 30 MG extended release tablet TAKE 1 TABLET BY MOUTH EVERY DAY ON EMPTY  STOMACH FOR 30 DAYS Oral for 90 Days      aspirin 81 MG EC tablet Take by mouth daily      atorvastatin (LIPITOR) 80 MG tablet 1 tablet      losartan (COZAAR) 100 MG tablet 1 tablet      metoprolol succinate (TOPROL XL) 50 MG extended release tablet Take 1 tablet by mouth 2 times daily       No current facility-administered medications for this visit.        Social History     Tobacco Use    Smoking status: Never    Smokeless tobacco: Never   Vaping Use    Vaping status: Never Used   Substance Use Topics    Alcohol use: Yes     Alcohol/week: 1.0 standard drink of alcohol     Types: 1 Glasses of wine per week    Drug use: Never        Family History   Problem Relation Age of Onset    Heart Attack Mother     Heart Attack Father     Heart Disease Brother         Review of Systems   Constitutional: Negative.    HENT: Negative.     Eyes: Negative.    Respiratory:  Negative for shortness of breath.    Cardiovascular:  Negative  for chest pain and palpitations.   Gastrointestinal: Negative.    Endocrine: Negative.    Genitourinary: Negative.    Musculoskeletal:  Positive for arthralgias and gait problem.   Allergic/Immunologic: Negative.    Hematological: Negative.    Psychiatric/Behavioral: Negative.       Physical Exam  Vitals and nursing note reviewed.   Constitutional:       Appearance: Normal appearance.   HENT:      Head: Normocephalic and atraumatic.      Right Ear: External ear normal.      Left Ear: External ear normal.      Nose: Nose normal.      Mouth/Throat:      Mouth: Mucous membranes are dry.      Pharynx: Oropharynx is clear.   Eyes:      Extraocular Movements: Extraocular movements intact.      Conjunctiva/sclera: Conjunctivae normal.      Pupils: Pupils are equal, round, and reactive to light.   Neck:      Thyroid: No thyroid mass.      Vascular: Carotid bruit present. No JVD.      Trachea: Trachea normal.   Cardiovascular:      Rate and Rhythm: Normal rate and regular rhythm.      Pulses:            Carotid pulses are 1+ on the right side and 1+ on the left side.       Radial pulses are 1+ on the right side and 1+ on the left side.        Femoral pulses are 1+ on the right side and 1+ on the left side.       Popliteal pulses are 1+ on the right side and 1+ on the left side.        Dorsalis pedis pulses are 1+ on the right side and 1+ on the left side.        Posterior tibial pulses are 1+ on the right side and 1+ on the left side.      Heart sounds: S1 normal and S2 normal. Murmur heard.      Crescendo decrescendo systolic murmur is present with a grade of 2/6.      Gallop present. S4 sounds present.   Pulmonary:      Effort: Pulmonary effort is normal.      Breath sounds: Normal breath sounds.   Abdominal:      General: Abdomen is flat. Bowel sounds are normal.      Palpations: Abdomen is soft.   Musculoskeletal:         General: Normal range of motion.      Cervical back: Normal range of motion and neck supple.      Right lower leg: No edema.      Left lower leg: No edema.   Lymphadenopathy:      Cervical: No cervical adenopathy.   Skin:     General: Skin is warm and dry.      Capillary Refill: Capillary refill takes 2 to 3 seconds.   Neurological:      General: No focal deficit present.      Mental Status: He is alert and oriented to person, place, and time.   Psychiatric:         Mood and Affect: Mood normal.       ASSESSMENT/PLAN:  1. Exertional shortness of breath  2. Primary hypertension  3. Chronic diastolic (congestive) heart failure (  HCC)  4. Coronary artery disease involving coronary bypass graft of native heart without angina pectoris  5. Abnormal ECG  6. Systolic murmur  7. Mitral valve disease  8. Pulmonary hypertension (HCC)  9. Occlusion of right carotid artery  10. Hypercholesteremia  11. Bilateral leg edema    His degree of shortness of breath has improved.  It occurs mostly with walking up steps.  His last echocardiogram shows normal heart function but with grade 2 diastolic dysfunction and  evidence of pulm hypertension with pulmonary pressures of 52 mmHg.  Patient states at present he can exercise and carry on daily activities without restriction.  Only going up steps may elicit shortness of breath if he is carrying heavy objects.  I would not recommend any additional treatment at this time but more conservative management, dietary restraint and sodium restriction    No results found for any visits on 10/02/22.     Return in about 7 months (around 05/02/2023) for follow up.    On this date 10/02/2022 I have spent 41 minutes reviewing previous notes, test results and face to face with the patient discussing the diagnosis and importance of compliance with the treatment plan as well as documenting on the day of the visit.  An electronic signature was used to authenticate this note.    --Gurney Maxin, MD

## 2022-10-04 LAB — VAS DUP LOWER EXTREMITY VENOUS BILATERAL: Body Surface Area: 1.99 m2

## 2023-04-17 ENCOUNTER — Ambulatory Visit: Admit: 2023-04-17 | Discharge: 2023-04-17 | Payer: MEDICARE | Attending: Family | Primary: Family

## 2023-04-17 VITALS — Resp 16 | Ht 69.0 in | Wt 177.0 lb

## 2023-04-17 DIAGNOSIS — R32 Unspecified urinary incontinence: Secondary | ICD-10-CM

## 2023-04-17 LAB — AMB POC URINALYSIS DIP STICK AUTO W/O MICRO
Bilirubin, Urine, POC: NEGATIVE
Blood, Urine, POC: NEGATIVE
Glucose, Urine, POC: NEGATIVE
Ketones, Urine, POC: NEGATIVE
Leukocyte Esterase, Urine, POC: NEGATIVE
Nitrite, Urine, POC: NEGATIVE
Specific Gravity, Urine, POC: 1.025 (ref 1.001–1.035)
Urobilinogen, POC: 0.2
pH, Urine, POC: 7 (ref 4.6–8.0)

## 2023-04-17 MED ORDER — TAMSULOSIN HCL 0.4 MG PO CAPS
0.4 | ORAL_CAPSULE | Freq: Every day | ORAL | 1 refills | Status: DC
Start: 2023-04-17 — End: 2023-12-30

## 2023-04-17 NOTE — Progress Notes (Signed)
 Dr. Letta Median is my supervising physician today     ASSESSMENT:   Encounter Diagnoses   Name Primary?    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  Pretreatment PSA of 67.3.      Treated with IMRT + 2 years of ADT.  Completed IMRT 04/20/2018.    Treated in [place], [place].  Patient recently moved to our area and is here to establish care.    Most recent PSA 0.29ng/ml on 10/25/21 (T 471), prior was 0.14ng/ml  (T 385) on 05/03/21.      -PSA most recent 0.14ng/mL on 05/03/21.      -ECOG 0    - CKD    Follows with Nephrology.     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      PLAN:   PVR today 40 cc  UA today neg   Ucx sent, will treat if indicated   Trial Flomax 0.4 mg for sx, R/B and SE discussed with patient. Rx sent to pharmacy.  FU as scheduled with J Galford NP,happy to see back for BPH screen if warranted       Chief Complaint   Patient presents with    Incontinence     Slow stream and dribbling        HISTORY OF PRESENT ILLNESS:  John Chaney is a 83 y.o. male who presents today for urinary hesitancy. Patient was last seen by Aliene Altes, PA-C on 05/09/22.    Noted slow FOS and dribbing for 1 week-presented to UC and Rx'd Levaquin with no relief. Denies flank pain, dysuria, hematuria. No f/c/n/v.      Prior hx:  05/09/22: Patient was last seen by Aliene Altes, PA-C is seen  in follow-up of prostate cancer.      Patient presents today doing well. He notes issues with ED since his radiation therapy - interested in therapy again today. Otherwise no complaints. No voiding issues.           Past Medical History:   Diagnosis Date    Atrial fibrillation (HCC)     Coronary artery disease involving native coronary artery of native heart without angina pectoris     Dyslipidemia     Primary hypertension     Prostate cancer (HCC)     S/P CABG  (coronary artery bypass graft)        History reviewed. No pertinent surgical history.    Social History     Tobacco Use    Smoking status: Never    Smokeless tobacco: Never   Vaping Use    Vaping status: Never Used   Substance Use Topics    Alcohol use: Yes     Alcohol/week: 1.0 standard drink of alcohol     Types: 1 Glasses of wine per week    Drug use: Never       No Known Allergies    Family History   Problem Relation Age of Onset    Heart Attack Mother     Heart Attack Father     Heart Disease Brother        Current Outpatient Medications   Medication Sig Dispense Refill    tamsulosin (FLOMAX) 0.4 MG capsule Take 1 capsule by mouth daily 90 capsule 1  sildenafil (VIAGRA) 100 MG tablet Take 1 tablet by mouth as needed for Erectile Dysfunction 30 tablet 3    Omega-3 Fatty Acids (FISH OIL) 500 MG CAPS 500 mg daily      Calcium Carb-Cholecalciferol (CALCIUM 1000 + D) 1000-20 MG-MCG TABS daily      NIFEdipine (ADALAT CC) 30 MG extended release tablet TAKE 1 TABLET BY MOUTH EVERY DAY ON EMPTY STOMACH FOR 30 DAYS Oral for 90 Days      aspirin 81 MG EC tablet Take by mouth daily      atorvastatin (LIPITOR) 80 MG tablet 1 tablet      losartan (COZAAR) 100 MG tablet 1 tablet      metoprolol succinate (TOPROL XL) 50 MG extended release tablet Take 1 tablet by mouth 2 times daily       No current facility-administered medications for this visit.         PHYSICAL EXAMINATION:   Resp 16   Ht 1.753 m (5\' 9" )   Wt 80.3 kg (177 lb)   BMI 26.14 kg/m   Constitutional: WDWN, Pleasant and appropriate affect, No acute distress.    CV:  No peripheral swelling noted  Respiratory: No respiratory distress or difficulties  Abdomen: Non-distended  Skin: No jaundice.    Neuro/Psych:  Alert and oriented x 3, affect appropriate.   Msk: FROM          REVIEW OF LABS AND IMAGING:      Results for orders placed or performed in visit on 04/17/23   Culture, Urine    Specimen: Urine, clean catch    Urine, clean catch   Result Value Ref Range     Urine Culture, Routine No growth    AMB POC PVR, MEAS,POST-VOID RES,US,NON-IMAGING   Result Value Ref Range    PVR, POC 40 cc   AMB POC URINALYSIS DIP STICK AUTO W/O MICRO   Result Value Ref Range    Color, Urine, POC Yellow     Clarity, Urine, POC Clear     Glucose, Urine, POC Negative     Bilirubin, Urine, POC Negative     Ketones, Urine, POC Negative     Specific Gravity, Urine, POC 1.025 1.001 - 1.035    Blood, Urine, POC Negative Negative    pH, Urine, POC 7.0 4.6 - 8.0    Protein, Urine, POC 3+     Urobilinogen, POC 0.2 mg/dL     Nitrite, Urine, POC Negative     Leukocyte Esterase, Urine, POC Negative        Darol Destine, NP  [facility].   [place], [place] [address]  [phone] (office)          Medical documentation is prepped with the assistance of Ruksar Anjum for [facility], NP-BC on 05/22/2023

## 2023-04-19 LAB — CULTURE, URINE: Urine Culture, Routine: NO GROWTH

## 2023-04-30 LAB — HEMOGLOBIN A1C
Estimated Avg Glucose, External: 136 mg/dL — ABNORMAL HIGH (ref 91–123)
Hemoglobin A1C, External: 6.4 % — ABNORMAL HIGH (ref 4.8–5.6)

## 2023-05-06 ENCOUNTER — Encounter: Payer: PRIVATE HEALTH INSURANCE | Attending: Cardiovascular Disease | Primary: Family

## 2023-05-08 ENCOUNTER — Encounter: Attending: Physician Assistant | Primary: Family

## 2023-05-12 NOTE — Telephone Encounter (Signed)
-----   Message from Angelyn Punt, PA sent at 05/09/2023 5:24 PM EST -----  Regarding: post hospital    hospital follow up for dr newby or I. Left Wednesday

## 2023-05-14 NOTE — Telephone Encounter (Signed)
 Form refaxed as requested on 05/14/23 at 1:12 PM. Confirmation available in media.

## 2023-05-14 NOTE — Telephone Encounter (Signed)
 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 She requested the form be faxed to [phone]

## 2023-05-19 ENCOUNTER — Encounter

## 2023-05-19 NOTE — Telephone Encounter (Signed)
 Patient calling in ref to would like a PSA and T lab order mailed to him will be going to [facility].  Patient can be reached [phone]    [address]  [place]. [address]    Patient stated would like to have our fax # on the order so we be able to get the lab results.

## 2023-05-26 NOTE — Telephone Encounter (Signed)
 Incoming call from Gwenith Lente, two pt identifiers verified, name and dob for John Chaney.   Patient stated ," he is having SOB and general body weakness which he is not able to stand up. He stated he was started on new medications in [facility] hospital and not sure what to do. " He went to [facility] 3/19 was d/c'd 05/10/23 went back 2 days later and was kept for 1 day. Patient wants to speak to Dr. Clora Dane I informed him [facility] message will be sent to him as well. Informed patient I will send message to nurse and Dr. Clora Dane and nurse will contact him back.Gwenith Lente voiced understanding.

## 2023-05-30 NOTE — Telephone Encounter (Signed)
-----   Message from Adelfa Adolph, PA sent at 05/30/23 10:16 AM -----    Hospital follow up for Dr newby or I. Left yesterday. Chf     ----- Message from Allyssa Capizola, RN sent at 05/30/23 10:19 AM -----    this patient has a post-hospital scheduled Monday w you, I think he was readmitted- is that appointment okay, or do you want me to try to reschedule a few weeks out?     ----- Message from Adelfa Adolph, PA sent at 05/30/23 10:20 AM -----    He was readmitted, if there's not a spot in a week or two then just leave it. I think he thought that had been cancelled so if you leave it just make sure he knows to still come     -----

## 2023-06-02 ENCOUNTER — Encounter: Payer: PRIVATE HEALTH INSURANCE | Primary: Family

## 2023-06-03 LAB — PSA, DIAGNOSTIC: PSA: 0.19 ng/mL (ref <=4.00)

## 2023-06-03 LAB — TESTOSTERONE: Total Testosterone: 538 ng/dL (ref 250–827)

## 2023-06-05 ENCOUNTER — Ambulatory Visit: Admit: 2023-06-05 | Discharge: 2023-06-05 | Payer: MEDICARE | Primary: Family

## 2023-06-05 VITALS — Ht 69.0 in | Wt 186.0 lb

## 2023-06-05 DIAGNOSIS — Z8546 Personal history of malignant neoplasm of prostate: Secondary | ICD-10-CM

## 2023-06-05 NOTE — Progress Notes (Signed)
 My supervising physician today is Dr. Barclay Leyden.  This patient is being followed long term for the care of the conditions outlined below.     ASSESSMENT:     ICD-10-CM    1. Personal history of malignant neoplasm of prostate  Z85.46       2. Erectile dysfunction following radiation therapy  N52.35       3. 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  Pretreatment PSA of 67.3.      Treated with IMRT + 2 years of ADT.  Completed IMRT 04/20/2018.    Treated in [place], [place].      Most recent PSA 0.19 ng/ml on 06/02/23 (T 538)      -ECOG 0    - CKD    Follows with Nephrology.     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    Has not tried Viagra - prescribed last visit due to concern about hx a.fib and CHF    -LUTS   Taking Flomax  0.4 mg with benefit     PLAN:   Reviewed most recent labs 06/02/23: PSA 0.19 ng/ml, T 538  Continue flomax  0.4 mg daily- happy to refill as needed   Follow up with cardiology as scheduled   Follow up in 1 year with PSA and T prior          Chief Complaint   Patient presents with    Personal history of malignant neoplasm of prostate     Room 18       HISTORY OF PRESENT ILLNESS:  John Chaney is a 83 y.o. male who is seen  in follow-up of prostate cancer.      Patient presents today doing well. He notes issues with ED since his radiation therapy - interested in therapy again today. Otherwise no complaints. No voiding issues.      Expresses concern today about increase in weight of 3 lbs- he tracks daily weights as encouraged by cardiology. He has follow up with cardiology in 4 days. Denies any chest pain or shortness of breath.      Past Medical History:   Diagnosis Date    Atrial fibrillation (HCC)     Coronary artery disease involving native coronary artery of native heart without angina pectoris     Dyslipidemia     Primary hypertension     Prostate cancer (HCC)     S/P CABG (coronary artery bypass  graft)        No past surgical history on file.    Social History     Tobacco Use    Smoking status: Never    Smokeless tobacco: Never   Vaping Use    Vaping status: Never Used   Substance Use Topics    Alcohol use: Yes     Alcohol/week: 1.0 standard drink of alcohol     Types: 1 Glasses of wine per week    Drug use: Never       No Known Allergies    Family History   Problem Relation Age of Onset    Heart Attack Mother     Heart Attack Father     Heart Disease Brother        Current Outpatient Medications   Medication Sig Dispense Refill    amLODIPine (NORVASC) 10 MG tablet Take 1 tablet by mouth  apixaban (ELIQUIS) 2.5 MG TABS tablet Take 1 tablet by mouth      bumetanide (BUMEX) 1 MG tablet Take 1 tablet by mouth daily      tamsulosin  (FLOMAX ) 0.4 MG capsule Take 1 capsule by mouth daily 90 capsule 1    sildenafil  (VIAGRA ) 100 MG tablet Take 1 tablet by mouth as needed for Erectile Dysfunction 30 tablet 3    Omega-3 Fatty Acids (FISH OIL) 500 MG CAPS 500 mg daily      Calcium Carb-Cholecalciferol (CALCIUM 1000 + D) 1000-20 MG-MCG TABS daily      NIFEdipine (ADALAT CC) 30 MG extended release tablet TAKE 1 TABLET BY MOUTH EVERY DAY ON EMPTY STOMACH FOR 30 DAYS Oral for 90 Days      aspirin 81 MG EC tablet Take by mouth daily      atorvastatin (LIPITOR) 80 MG tablet 1 tablet      losartan (COZAAR) 100 MG tablet 1 tablet      metoprolol succinate (TOPROL XL) 50 MG extended release tablet Take 1 tablet by mouth 2 times daily       No current facility-administered medications for this visit.         PHYSICAL EXAMINATION:   Ht 1.753 m (5\' 9" )   Wt 84.4 kg (186 lb)   BMI 27.47 kg/m   Constitutional: WDWN, Pleasant and appropriate affect, No acute distress.    CV:  No peripheral swelling noted  Respiratory: No respiratory distress or difficulties  Abdomen: Non-distended  Skin: No jaundice.    Neuro/Psych:  Alert and oriented x 3, affect appropriate.   Msk: FROM      REVIEW OF LABS AND IMAGING:      Lab Results    Component Value Date/Time    PSA 0.19 06/02/2023 09:19 AM    PSA 0.14 05/03/2021 02:01 PM    PSA <0.1 10/25/2020 12:58 PM    PSA <0.014 04/24/2020 12:00 AM         No results found for this visit on 06/05/23.    Aren Pryde E Siyah Mault, APRN - NP  [facility] 

## 2023-06-09 ENCOUNTER — Ambulatory Visit
Admit: 2023-06-09 | Discharge: 2023-06-09 | Payer: PRIVATE HEALTH INSURANCE | Attending: Cardiovascular Disease | Primary: Family

## 2023-06-09 VITALS — BP 138/65 | HR 68 | Temp 97.40000°F | Resp 15 | Ht 69.0 in | Wt 182.8 lb

## 2023-06-09 DIAGNOSIS — R0602 Shortness of breath: Secondary | ICD-10-CM

## 2023-06-09 MED ORDER — METOLAZONE 5 MG PO TABS
5 | ORAL_TABLET | Freq: Every day | ORAL | 11 refills | 30.00000 days | Status: DC
Start: 2023-06-09 — End: 2023-12-01

## 2023-06-09 MED ORDER — APIXABAN 2.5 MG PO TABS
2.5 | ORAL_TABLET | Freq: Two times a day (BID) | ORAL | 11 refills | 30.00000 days | Status: AC
Start: 2023-06-09 — End: ?

## 2023-06-09 NOTE — Progress Notes (Signed)
 John Chaney (DOB:  1940-06-18) is a 83 y.o. male,Established patient, here for evaluation of the following chief complaint(s):  Follow-up (1 week post hospital)      Subjective   SUBJECTIVE/OBJECTIVE:  History of Present Illness  The patient presents for evaluation of hearing impairment, congestive heart failure, atrial fibrillation, and stage IV chronic kidney disease. He is accompanied by his wife. He has a history of coronary artery disease and is status post bypass surgery in 2013. He relocated here approximately a year ago. Patient was having shortness of breath at the time I originally saw him, but told me that it improved over time. Since that time, he states it has worsened again. He states that going up steps or any significant physical activity causes excess shortness of breath. No palpitations. No chest discomfort. He does have hypertension and hyperlipidemia, but no other acute issues at this time. He is a nonsmoker. He has been tested for COPD which was negative previously.  He does have left leg swelling and needs further definition.  He had a syncopal episode a few months ago and was hospitalized briefly with a negative cardiac workup. .    A general sense of well-being is reported, with no complaints of chest pain, shortness of breath, palpitations, lightheadedness, or dizziness. An active lifestyle is maintained, including regular gym attendance and work in his shop. His wife reports two hospitalizations, during which echocardiograms were performed. The second hospitalization was due to congestive heart failure, characterized by a significant drop in body temperature to 94.5 degrees, necessitating ICU admission and the use of heating blankets. Evone Hoh was initiated during his hospital stay but subsequently discontinued. The current medication regimen includes Bumex 2 mg daily, which has resulted in increased urination, although not excessively so. Recent lab tests were ordered by his  nephrologist at [facility], and an appointment with the nephrologist is pending.    His wife also mentions that the supply of Eliquis, prescribed during a previous episode of atrial fibrillation, has been exhausted.    Problems with hearing are noted. His hearing aid broke and is in for repair. He only has one hearing aid and is not hearing at all.    SOCIAL HISTORY  Marital Status: Married  Occupations: Works in a shop  Exercise: Goes to [employer]    I have carefully reviewed all available medical records, previous office notes, lab, x-ray and procedure reports    Past Medical History:   Diagnosis Date    Atrial fibrillation (HCC)     Coronary artery disease involving native coronary artery of native heart without angina pectoris     Dyslipidemia     Primary hypertension     Prostate cancer (HCC)     S/P CABG (coronary artery bypass graft)         No past surgical history on file.     No Known Allergies     Current Outpatient Medications   Medication Sig Dispense Refill    TYRVAYA 0.03 MG/ACT SOLN       metOLazone (ZAROXOLYN) 5 MG tablet Take 1 tablet by mouth daily 30 tablet 11    apixaban (ELIQUIS) 2.5 MG TABS tablet Take 1 tablet by mouth 2 times daily 60 tablet 11    amLODIPine (NORVASC) 10 MG tablet Take 1 tablet by mouth      bumetanide (BUMEX) 1 MG tablet Take 1 tablet by mouth daily      tamsulosin  (FLOMAX ) 0.4 MG capsule Take 1 capsule by mouth  daily 90 capsule 1    Omega-3 Fatty Acids (FISH OIL) 500 MG CAPS 500 mg daily      Calcium Carb-Cholecalciferol (CALCIUM 1000 + D) 1000-20 MG-MCG TABS daily      aspirin 81 MG EC tablet Take by mouth daily      atorvastatin (LIPITOR) 80 MG tablet 1 tablet      metoprolol succinate (TOPROL XL) 50 MG extended release tablet Take 1 tablet by mouth 2 times daily      sildenafil  (VIAGRA ) 100 MG tablet Take 1 tablet by mouth as needed for Erectile Dysfunction (Patient not taking: Reported on 06/09/2023) 30 tablet 3    NIFEdipine (ADALAT CC) 30 MG extended release tablet TAKE 1  TABLET BY MOUTH EVERY DAY ON EMPTY STOMACH FOR 30 DAYS Oral for 90 Days      losartan (COZAAR) 100 MG tablet 1 tablet (Patient not taking: Reported on 06/09/2023)       No current facility-administered medications for this visit.        Social History     Tobacco Use    Smoking status: Never    Smokeless tobacco: Never   Vaping Use    Vaping status: Never Used   Substance Use Topics    Alcohol use: Yes     Alcohol/week: 1.0 standard drink of alcohol     Types: 1 Glasses of wine per week    Drug use: Never        Family History   Problem Relation Age of Onset    Heart Attack Mother     Heart Attack Father     Heart Disease Brother         Review of Systems   Constitutional: Negative.    HENT: Negative.     Eyes: Negative.    Respiratory:  Negative for shortness of breath.    Cardiovascular:  Negative for chest pain and palpitations.   Gastrointestinal: Negative.    Endocrine: Negative.    Genitourinary: Negative.    Allergic/Immunologic: Negative.    Neurological: Negative.    Hematological: Negative.    Psychiatric/Behavioral: Negative.         Physical Exam  General: Well developed, patient in no acute distress.  HEENT: Pupils are equal, round, and reactive to light. Extraocular movements are full.  Neck: Supple, no jugular venous distention, no carotid bruits. Trachea is midline, no thyromegaly.  Heart: Regular rate and rhythm. 1/6 systolic murmur at the left lower sternal border, 2/6 systolic murmur in the aortic area, 1/6 systolic murmur at the left lower sternal border. S4 gallop present.  Lungs: Clear to auscultation.  Abdomen: Soft, nondistended, nontender. Bowel sounds normal, no masses noted.  Extremities: No cyanosis, clubbing, or edema. Pulses normal. 2+ edema in both legs.  Skin: Otherwise clear.      ASSESSMENT/PLAN:   Assessment & Plan  1. Chronic diastolic heart failure.  - Mildly decompensated. Add metolazone 5 mg every Monday, Wednesday, and Friday to the regimen, with the possibility of increasing  the dose as needed. Bumetanide 2 mg daily is currently prescribed. Monitor fluid intake, limiting to 48 ounces per day, and sodium intake to <2000 mg per day.    2. Chronic kidney disease.  - Stage IV. Regular monitoring is essential due to the potential need for dialysis in the future. Ensure nephrologist follow-up and lab work to assess kidney function and the impact of Bumex.    3. Atrial fibrillation.  - Prescribe Eliquis to manage stroke risk.  Send prescription to [facility] [address]    4. Hypertension.  - Currently well-controlled under the existing medical regimen.    5. Hearing impairment.  - Hearing aid is broken and in for repair. He is currently unable to hear well due to having only one hearing aid.    Follow-up:  - No immediate follow-up required with cardiology, but ensure nephrologist appointment is scheduled for ongoing kidney function monitoring.    Results  Labs   - Kidney function test: Stage IV kidney disease    1. Exertional shortness of breath  2. Chronic diastolic (congestive) heart failure (HCC)  3. Mitral valve disease  4. Bilateral leg edema  5. Primary hypertension  6. Coronary artery disease involving coronary bypass graft of native heart without angina pectoris  7. Abnormal ECG  8. Pulmonary hypertension (HCC)  9. 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 Hypercholesteremia    No results found for any visits on 06/09/23.     Return in about 7 months (around 01/09/2024).    On this date 06/09/2023 I have spent 41 minutes reviewing previous notes, test results and face to face with the patient discussing the diagnosis and importance of compliance with the treatment plan as well as documenting on the day of the visit.    The patient (or guardian, if applicable) and other individuals in attendance with the patient were advised that Artificial Intelligence will be utilized during this visit to record, process the conversation to generate a clinical note and to support  improvement of the AI technology. The patient (or guardian, if applicable) and other individuals in attendance at the appointment consented to the use of AI, including the recording.       An electronic signature was used to authenticate this note.    --Nohemi Batters, MD

## 2023-06-10 NOTE — Telephone Encounter (Signed)
 Rachael from [facility] called and requested last office visit note for Dr. Margie Sheller to review.   Fax number:  [phone]  OV 06/09/23 Faxed

## 2023-06-25 NOTE — Telephone Encounter (Signed)
 Called wife at the number listed and she identified herself by name and identified the patient by full name and dob.    She stated that for the last 4-5 days he has been having dizziness when he gets up from a sitting or lying position.   He has seen been to see his primary care and Nephrologist within the past two weeks. No med changes and pt is sticking to his low sodium and fluid restrictions.     BP  06/25/23     7 am         129/66  70  06/24/23     9 am         125/65  68     5 pm     115/63  65     135/65  70  06/25/23     830 am     126/67  72                   930 am      105/62                   1230 pm    115/64  63    Will send message to Nurse Navigator to see if he needs to been seen in office or other facility.Aaron Aas

## 2023-06-25 NOTE — Telephone Encounter (Signed)
 John Chaney is calling on behalf of John Chaney because he is experiencing low bp and dizziness to the point when he walks, he has to stop and sit down to catch himself from falling. Rosalynd Combs is concerned.  Please give her a call back about her husband John Chaney.          Preferred call back number ; [phone]

## 2023-06-25 NOTE — Telephone Encounter (Signed)
 Outgoing call to Arletta Bender, wife of Delorean Knutzen, confirmed patient name and date of birth. Coordinated follow up appointment with Dr. Clora Dane to assess recent dizziness/weakness. Patient has recently been assessed by PCP and nephrology, still pending labs from those doctors per wife.   Wife concerned that with fluid restrictions patient is dehydrated. Instructed wife to maintain prescribed diet and fluid restrictions until appointment with Dr. Clora Dane.   Patient appointment added to waitlist in case sooner appointment becomes available.

## 2023-06-25 NOTE — Telephone Encounter (Signed)
 Reviewed message and called the patient, at the only phone number listed. Received no answer and I left a voice message asking the patient to return call, as I was calling about his call this morning.Wanting to get further informaiton.

## 2023-06-26 NOTE — Telephone Encounter (Signed)
 PCP: Kandee Orion, ACNP    Last appt:  06/09/2023   Future Appointments   Date Time Provider Department Center   07/09/2023 11:00 AM Walden Guise., MD [facility] BS AMB   02/27/2024 10:00 AM Aundria Bloom Sr., MD [facility] BS AMB   06/01/2024  9:00 AM [facility] [employer] [facility]ho Sched   06/08/2024  9:20 AM Galford, Jessica E, APRN - NP [facility]       Requested Prescriptions     Pending Prescriptions Disp Refills    amLODIPine (NORVASC) 10 MG tablet 90 tablet 3     Sig: Take 1 tablet by mouth daily       Request for a 30 or 90 day supply? Provider Discretion    Pharmacy: CONFIRMED    Other Comments: N?A

## 2023-06-27 MED ORDER — AMLODIPINE BESYLATE 10 MG PO TABS
10 | ORAL_TABLET | Freq: Every day | ORAL | 3 refills | 90.00000 days | Status: AC
Start: 2023-06-27 — End: ?

## 2023-07-09 ENCOUNTER — Ambulatory Visit: Admit: 2023-07-09 | Discharge: 2023-07-09 | Payer: PRIVATE HEALTH INSURANCE | Primary: Family

## 2023-07-09 ENCOUNTER — Ambulatory Visit
Admit: 2023-07-09 | Discharge: 2023-07-09 | Payer: PRIVATE HEALTH INSURANCE | Attending: Cardiovascular Disease | Primary: Family

## 2023-07-09 VITALS — BP 124/80

## 2023-07-09 VITALS — BP 140/78 | HR 63 | Temp 97.10000°F | Ht 69.0 in | Wt 173.0 lb

## 2023-07-09 DIAGNOSIS — I6521 Occlusion and stenosis of right carotid artery: Secondary | ICD-10-CM

## 2023-07-09 DIAGNOSIS — R0602 Shortness of breath: Secondary | ICD-10-CM

## 2023-07-09 NOTE — Progress Notes (Signed)
 1. "Have you been to the ER, urgent care clinic since your last visit?  Hospitalized since your last visit?" Reviewed by Dr. Gurney Maxin     2. "Have you seen or consulted any other health care providers outside of the [facility] since your last visit?" Reviewed by Dr. Gurney Maxin

## 2023-07-09 NOTE — Progress Notes (Signed)
 John Chaney (DOB:  08/12/1940) is a 83 y.o. male,Established patient, here for evaluation of the following chief complaint(s):  Follow-up (pt req; dizziness, near-syncope)      Subjective   SUBJECTIVE/OBJECTIVE:  History of Present Illness  The patient presents for evaluation of  congestive heart failure, atrial fibrillation, and stage IV chronic kidney disease. He is accompanied by his wife. He has a history of coronary artery disease and is status post bypass surgery in 2013. He relocated here approximately 2 years ago. He does have hypertension and hyperlipidemia, but no other acute issues at this time. He is a nonsmoker. He has been tested for COPD which was negative previously.  He does have left leg swelling and needs further definition.  He had a syncopal episode a few months ago and was hospitalized briefly with a negative cardiac workup.    He has been experiencing significant lightheadedness, leading to instability and uncertainty in his movements, particularly when rising from a seated position. Although the severity of these symptoms has lessened, they persist. He does not experience dizziness. Hesitation is noted when navigating stairs, despite having a chairlift at home. Several years ago, he discontinued riding his two-wheeled bicycle due to balance concerns. He continues to engage in regular gym activities. He has been on amiodarone therapy.    Blood pressure readings at home have been within normal limits.    Aortic valve disease is present.    Scheduled ear surgery on 07/18/2023, initially planned for 05/2023 but postponed due to a recent hospital discharge. He underwent a heart catheterization procedure performed by Dr. Authur Leghorn prior to relocating.    PAST SURGICAL HISTORY:  Heart catheterization performed by Dr. Authur Leghorn prior to relocating.    SOCIAL HISTORY  Exercise: Goes to the gym regularly    I have carefully reviewed all available medical records, previous office notes, lab,  x-ray and procedure reports    Past Medical History:   Diagnosis Date    Atrial fibrillation (HCC)     Coronary artery disease involving native coronary artery of native heart without angina pectoris     Dyslipidemia     Primary hypertension     Prostate cancer (HCC)     S/P CABG (coronary artery bypass graft)         No past surgical history on file.     No Known Allergies     Current Outpatient Medications   Medication Sig Dispense Refill    amiodarone (CORDARONE) 200 MG tablet 1 tablet Oral Once a day for 90 days      amLODIPine  (NORVASC ) 10 MG tablet Take 1 tablet by mouth daily 90 tablet 3    TYRVAYA 0.03 MG/ACT SOLN       metOLazone  (ZAROXOLYN ) 5 MG tablet Take 1 tablet by mouth daily 30 tablet 11    apixaban  (ELIQUIS ) 2.5 MG TABS tablet Take 1 tablet by mouth 2 times daily 60 tablet 11    bumetanide (BUMEX) 1 MG tablet Take 1 tablet by mouth daily      tamsulosin  (FLOMAX ) 0.4 MG capsule Take 1 capsule by mouth daily 90 capsule 1    Omega-3 Fatty Acids (FISH OIL) 500 MG CAPS 500 mg daily      Calcium Carb-Cholecalciferol (CALCIUM 1000 + D) 1000-20 MG-MCG TABS daily      aspirin 81 MG EC tablet Take by mouth daily      atorvastatin (LIPITOR) 80 MG tablet 1 tablet      sildenafil  (VIAGRA ) 100 MG  tablet Take 1 tablet by mouth as needed for Erectile Dysfunction (Patient not taking: Reported on 07/09/2023) 30 tablet 3    NIFEdipine (ADALAT CC) 30 MG extended release tablet TAKE 1 TABLET BY MOUTH EVERY DAY ON EMPTY STOMACH FOR 30 DAYS Oral for 90 Days (Patient not taking: Reported on 07/09/2023)      losartan (COZAAR) 100 MG tablet 1 tablet (Patient not taking: Reported on 07/09/2023)      metoprolol succinate (TOPROL XL) 50 MG extended release tablet Take 1 tablet by mouth 2 times daily (Patient not taking: Reported on 07/09/2023)       No current facility-administered medications for this visit.        Social History     Tobacco Use    Smoking status: Never    Smokeless tobacco: Never   Vaping Use    Vaping status:  Never Used   Substance Use Topics    Alcohol use: Yes     Alcohol/week: 1.0 standard drink of alcohol     Types: 1 Glasses of wine per week    Drug use: Never        Family History   Problem Relation Age of Onset    Heart Attack Mother     Heart Attack Father     Heart Disease Brother         Review of Systems   Constitutional: Negative.    HENT: Negative.     Eyes: Negative.    Respiratory:  Positive for shortness of breath.    Cardiovascular:  Negative for chest pain and palpitations.   Gastrointestinal: Negative.    Endocrine: Negative.    Genitourinary: Negative.    Allergic/Immunologic: Negative.    Neurological:  Positive for light-headedness.   Hematological: Negative.    Psychiatric/Behavioral: Negative.         Physical Exam  General: Well-developed male, in no acute distress.  HEENT: Eyes: Pupils equal, round, reactive to light and accommodation. Extraocular movements are full. Sclerae clear.  Neck: Supple. No jugular venous distention. Soft right carotid bruit. Trachea midline. No thyromegaly.  Heart: Normal S1 and S2. Regular rate and rhythm. 2/6 systolic murmur noted over the aortic area. S4 gallop present.  Lungs: Clear to auscultation.  Abdomen: Soft, nondistended, nontender. Bowel sounds present. No masses or organomegaly noted.  Extremities: No cyanosis, clubbing, or edema. Pulses 1+ in upper and lower extremities bilaterally.  Skin: Otherwise clear.      ASSESSMENT/PLAN:   Assessment & Plan  1. Lightheadedness:  - Symptoms suggest a potential vestibular issue rather than an orthostatic one, as dizziness occurs when bending the head over and raising it back.  - Carotid study will be ordered today to rule out any progression of carotid or vertebral artery occlusions from carotid artery stenosis.  - Hold off on amiodarone for 2 weeks to see if the dizziness subsides.  - If dizziness resolves, amiodarone may be discontinued permanently.  - EKG performed today showed normal rhythm.    2.  Hypertension:  - Hypertension is reasonably well controlled.  - Home blood pressure readings are within an acceptable range.  - Conservative management recommended at this time with no additional medication changes.    3. Systolic murmur:  - Echocardiogram conducted this year did not reveal any significant abnormalities.  - No additional interventions recommended at this time.  - Conservative management advised.    4. Preoperative clearance:  - Cleared for ear surgery scheduled on 07/18/2023 without any restrictions.  Follow-up:  - Call the office before restarting amiodarone if dizziness completely resolves.    Results  Imaging   - Echocardiogram: No significant abnormalities    Diagnostic Testing   - EKG: Normal rhythm    1. Exertional shortness of breath  -     EKG 12 Lead  2. Chronic diastolic (congestive) heart failure (HCC)  -     EKG 12 Lead  3. Mitral valve disease  -     EKG 12 Lead  4. Bilateral leg edema  -     EKG 12 Lead  5. Primary hypertension  -     EKG 12 Lead  6. Coronary artery disease involving coronary bypass graft of native heart without angina pectoris  -     EKG 12 Lead  7. Abnormal ECG  -     EKG 12 Lead  8. Pulmonary hypertension (HCC)  -     EKG 12 Lead  9. 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 Hypercholesteremia  -     EKG 12 Lead    Results for orders placed or performed in visit on 07/09/23   Vascular duplex carotid bilateral   Result Value Ref Range    Body Surface Area 1.95 m2    Right arm BP 130 mmHg    Right arm BP dia 78 mmHg    Left arm BP 124 mmHg    Left arm BP dia 80 mmHg    Left bulb PSV 59.3 cm/s    Left bulb EDV 20.4 cm/s    Left CCA dist PSV 65.4 cm/s    Left CCA dist EDV 19.6 cm/s    Left CCA mid PSV 75.50 cm/s    Left CCA mid EDV 24.10 cm/s    Left CCA prox PSV 77.5 cm/s    Left CCA prox EDV 19.2 cm/s    Left ICA dist PSV 33.5 cm/s    Left ICA dist EDV 15.0 cm/s     Left ICA mid PSV 49.4 cm/s    Left ICA mid EDV 16.0 cm/s    Left ICA prox PSV 51.9 cm/s    Left ICA prox EDV 18.0 cm/s    Left subclavian mid PSV 107.0 cm/s    Left ECA PSV 44.9 cm/s    Left ECA EDV 10.90 cm/s    Left vertebral PSV 44.8 cm/s    Left vertebral EDV 12.10 cm/s    Right bulb PSV 63.9 cm/s    Right bulb EDV 14.5 cm/s    Right cca dist PSV 61.9 cm/s    Right CCA dist EDV 14.3 cm/s    Right CCA mid PSV 61.10 cm/s    Right CCA mid EDV 11.00 cm/s    Right CCA prox PSV 103.0 cm/s    Right CCA prox EDV 21.4 cm/s    Right ICA dist PSV 43.2 cm/s    Right ICA dist EDV 7.9 cm/s    Right ICA mid PSV 146.0 cm/s    Right ICA mid EDV 43.4 cm/s    Right ICA prox PSV 115.0 cm/s    Right ICA prox EDV 42.2 cm/s    Right subclavian mid PSV 75.8 cm/s    Right subclavian prox PSV 102.0 cm/s    Right ECA PSV 60.5 cm/s  Right ECA EDV 12.60 cm/s    Right vertebral PSV 46.4 cm/s    Right vertebral EDV 12.70 cm/s   Results for orders placed or performed in visit on 07/09/23   EKG 12 Lead    Impression    Normal sinus rhythm, right axis deviation.  Right bundle branch block.  Left atrial enlargement.  First-degree AV block.  Diffuse repolarization abnormalities    ABNORMAL  Aundria Bloom, MD        Return in about 7 months (around 02/08/2024) for follow up.      On this date 07/09/2023 I have spent 41 minutes reviewing previous notes, test results and face to face with the patient discussing the diagnosis and importance of compliance with the treatment plan as well as documenting on the day of the visit.    The patient (or guardian, if applicable) and other individuals in attendance with the patient were advised that Artificial Intelligence will be utilized during this visit to record, process the conversation to generate a clinical note and to support improvement of the AI technology. The patient (or guardian, if applicable) and other individuals in attendance at the appointment consented to the use of AI, including the  recording.       An electronic signature was used to authenticate this note.    --Nohemi Batters, MD

## 2023-07-14 LAB — VAS DUP CAROTID BILATERAL
Body Surface Area: 1.95 m2
Left CCA dist EDV: 19.6 cm/s
Left CCA dist PSV: 65.4 cm/s
Left CCA mid EDV: 24.1 cm/s
Left CCA mid PSV: 75.5 cm/s
Left CCA prox EDV: 19.2 cm/s
Left CCA prox PSV: 77.5 cm/s
Left ECA EDV: 10.9 cm/s
Left ECA PSV: 44.9 cm/s
Left ICA dist EDV: 15 cm/s
Left ICA dist PSV: 33.5 cm/s
Left ICA mid EDV: 16 cm/s
Left ICA mid PSV: 49.4 cm/s
Left ICA prox EDV: 18 cm/s
Left ICA prox PSV: 51.9 cm/s
Left ICA/CCA PSV: 0.67
Left arm BP dia: 80 mmHg
Left arm BP: 124 mmHg
Left bulb EDV: 20.4 cm/s
Left bulb PSV: 59.3 cm/s
Left subclavian mid PSV: 107 cm/s
Left vertebral EDV: 12.1 cm/s
Left vertebral PSV: 44.8 cm/s
Right CCA dist EDV: 14.3 cm/s
Right CCA mid EDV: 11 cm/s
Right CCA mid PSV: 61.1 cm/s
Right CCA prox EDV: 21.4 cm/s
Right CCA prox PSV: 103 cm/s
Right ECA EDV: 12.6 cm/s
Right ECA PSV: 60.5 cm/s
Right ICA dist EDV: 7.9 cm/s
Right ICA dist PSV: 43.2 cm/s
Right ICA mid EDV: 43.4 cm/s
Right ICA mid PSV: 146 cm/s — AB
Right ICA prox EDV: 42.2 cm/s
Right ICA prox PSV: 115 cm/s
Right ICA/CCA PSV: 1.42
Right arm BP dia: 78 mmHg
Right arm BP: 130 mmHg
Right bulb EDV: 14.5 cm/s
Right bulb PSV: 63.9 cm/s
Right cca dist PSV: 61.9 cm/s
Right subclavian dist EDV: 17.3 cm/s
Right subclavian dist PSV: 80.7 cm/s
Right subclavian mid EDV: 22.1 cm/s
Right subclavian mid PSV: 75.8 cm/s
Right subclavian prox EDV: 28.8 cm/s
Right subclavian prox PSV: 102 cm/s
Right vertebral EDV: 12.7 cm/s
Right vertebral PSV: 46.4 cm/s

## 2023-07-24 NOTE — Telephone Encounter (Signed)
 Incoming vm from pts wife. Two patient identifiers confirmed. Pts wife stated she was advised to contact office regarding how pt was doing with he hold of amiodarone. She stated the pt has had issues and requested a return call. Will review.

## 2023-07-29 NOTE — Telephone Encounter (Signed)
 Contacted pts wife at [employer] number. Two patient Identifiers confirmed. Pt stated he was taken off amiodarone until 07/23/2023 his cochler implant. She stated he has not restarted amiodarone and has been doing better since being off medication. Per pt wife s/s dissolved once he stopped taking medication.  Pt verbalized understanding.

## 2023-09-12 LAB — HEMOGLOBIN A1C
Estimated Avg Glucose, External: 125 mg/dL — ABNORMAL HIGH (ref 91–123)
Hemoglobin A1C, External: 6 % — ABNORMAL HIGH (ref 4.8–5.6)

## 2023-09-13 LAB — HEMOGLOBIN A1C
Estimated Avg Glucose, External: 124 mg/dL — ABNORMAL HIGH (ref 91–123)
Hemoglobin A1C, External: 6 % — ABNORMAL HIGH (ref 4.8–5.6)

## 2023-09-25 ENCOUNTER — Ambulatory Visit: Admit: 2023-09-25 | Discharge: 2023-09-25 | Payer: MEDICARE | Primary: Family

## 2023-09-25 VITALS — Ht 69.0 in | Wt 170.6 lb

## 2023-09-25 DIAGNOSIS — N281 Cyst of kidney, acquired: Principal | ICD-10-CM

## 2023-09-25 NOTE — Progress Notes (Signed)
 My supervising physician today is Dr. Gwendlyn.  This patient is being followed long term for the care of the conditions outlined below.     ASSESSMENT:     ICD-10-CM    1. 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 History of prostate cancer  Z85.46       3. Benign localized prostatic hyperplasia with lower urinary tract symptoms (LUTS)  N40.1       4. 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  Pretreatment PSA of 67.3.      Treated with IMRT + 2 years of ADT.  Completed IMRT 04/20/2018.    Treated in [place], [place].      Most recent PSA 0.19 ng/ml on 06/02/23 (T 538)    -Bilateral likely small renal cysts including suspected hemorrhagic/proteinaceous cysts on MRI Lumbar spine wo contrast 09/12/23   RUS 09/05/23: Right 1.5 cm simple cyst upper pole. Left 3.2 cm cyst with internal echogenic material presumably blood products, increased compared to 3.0 cm previously.       -ECOG 0    - CKD    Follows with Nephrology   Most recent Creat 2.6, GFR 24 on 09/13/23     - ED after RT for prostate cancer   Has not tried Viagra - prescribed last visit due to concern about hx a.fib and CHF    -LUTS   Taking Flomax  0.4 mg with benefit     PLAN:   Reviewed most recent labs 06/02/23: PSA 0.19 ng/ml, T 538  Continue flomax  0.4 mg daily- happy to refill as needed   Follow up with cardiology as scheduled   Follow up in 3 months with MRI ABD prior for further characterization of renal cyst        Chief Complaint   Patient presents with    complex renal cyst     Room 12       HISTORY OF PRESENT ILLNESS:  John Chaney is a 83 y.o. male who is seen  in follow-up of prostate cancer.      Patient presents today doing well. He notes issues with ED since his radiation therapy - interested in therapy again today. Otherwise no complaints. No voiding issues.      Expresses concern today about increase in weight of 3 lbs- he tracks daily weights as encouraged by  cardiology. He has follow up with cardiology in 4 days. Denies any chest pain or shortness of breath.      Past Medical History:   Diagnosis Date    Atrial fibrillation (HCC)     Coronary artery disease involving native coronary artery of native heart without angina pectoris     Dyslipidemia     Primary hypertension     Prostate cancer (HCC)     S/P CABG (coronary artery bypass graft)        History reviewed. No pertinent surgical history.    Social History     Tobacco Use    Smoking status: Never    Smokeless tobacco: Never   Vaping Use    Vaping status: Never Used   Substance Use Topics    Alcohol use: Yes     Alcohol/week: 1.0 standard drink of alcohol     Types: 1 Glasses of wine per week    Drug use: Never  No Known Allergies    Family History   Problem Relation Age of Onset    Heart Attack Mother     Heart Attack Father     Heart Disease Brother        Current Outpatient Medications   Medication Sig Dispense Refill    amLODIPine  (NORVASC ) 10 MG tablet Take 1 tablet by mouth daily 90 tablet 3    TYRVAYA 0.03 MG/ACT SOLN       metOLazone  (ZAROXOLYN ) 5 MG tablet Take 1 tablet by mouth daily 30 tablet 11    apixaban  (ELIQUIS ) 2.5 MG TABS tablet Take 1 tablet by mouth 2 times daily 60 tablet 11    bumetanide (BUMEX) 1 MG tablet Take 1 tablet by mouth daily      tamsulosin  (FLOMAX ) 0.4 MG capsule Take 1 capsule by mouth daily 90 capsule 1    Omega-3 Fatty Acids (FISH OIL) 500 MG CAPS 500 mg daily      Calcium Carb-Cholecalciferol (CALCIUM 1000 + D) 1000-20 MG-MCG TABS daily      aspirin 81 MG EC tablet Take by mouth daily      atorvastatin (LIPITOR) 80 MG tablet 1 tablet       No current facility-administered medications for this visit.         PHYSICAL EXAMINATION:   Ht 1.753 m (5' 9)   Wt 77.4 kg (170 lb 9.6 oz)   BMI 25.19 kg/m   Constitutional: WDWN, Pleasant and appropriate affect, No acute distress.    CV:  No peripheral swelling noted  Respiratory: No respiratory distress or difficulties  Abdomen:  Non-distended  Skin: No jaundice.    Neuro/Psych:  Alert and oriented x 3, affect appropriate.   Msk: FROM      REVIEW OF LABS AND IMAGING:      Lab Results   Component Value Date/Time    PSA 0.19 06/02/2023 09:19 AM    PSA 0.14 05/03/2021 02:01 PM    PSA <0.1 10/25/2020 12:58 PM    PSA <0.014 04/24/2020 12:00 AM         No results found for this visit on 09/25/23.    Seibert Keeter E Aujanae Mccullum, APRN - NP  [facility] 

## 2023-10-22 NOTE — Telephone Encounter (Signed)
 Patient's wife called in to schedule post hospital f/u for pt. States pt was discharged Sunday, hospital d/c diuretic medication for pt, sent him home with potassium pills. Pt wife states since Sunday, pt has gained 4 lbs of fluid weight, and has swelling developing in legs, pt also SOB. Advised for patient to return to hospital to address fluid retention, nurse notified.

## 2023-11-04 ENCOUNTER — Ambulatory Visit: Admit: 2023-11-19 | Discharge: 2023-11-19 | Payer: PRIVATE HEALTH INSURANCE | Primary: Family

## 2023-11-04 ENCOUNTER — Ambulatory Visit
Admit: 2023-11-04 | Discharge: 2023-11-04 | Payer: PRIVATE HEALTH INSURANCE | Attending: Cardiovascular Disease | Primary: Family

## 2023-11-04 NOTE — Progress Notes (Signed)
 John Chaney (DOB:  29-May-1940) is a 83 y.o. male,Established patient, here for evaluation of the following chief complaint(s):  Follow-Up from Hospital (Post Afib, elevated BP, dizziness)      Subjective   SUBJECTIVE/OBJECTIVE:    History of Present Illness  The patient presents for evaluation of  congestive heart failure, atrial fibrillation, and stage IV chronic kidney disease. He is accompanied by his wife. He has a history of coronary artery disease and is status post bypass surgery in 2013. He relocated here approximately 2 years ago. He does have hypertension and hyperlipidemia, but no other acute issues at this time. He is a nonsmoker. He has been tested for COPD which was negative previously.  He does have left leg swelling and needs further definition.  He had a syncopal episode a few months ago and was hospitalized briefly with a negative cardiac workup.  He was hospitalized again with hypokalemia.  He was taken off diuretics and sent home but was placed back on from his nephrologist due to fluid retention.    He has been monitoring his blood pressure at home, noting that approximately half of the readings indicate atrial fibrillation. A heart monitor was used during a hospital stay but has not been used at home. He reports no issues with breathing, chest discomfort, or shortness of breath.    Lightheadedness is described as a sensation of drifting, leading to unsteadiness on his feet. These episodes have not occurred in the past two weeks but were frequent prior to his hospital admission. During his hospital stay, he received 3 to 4 IV infusions of potassium and was temporarily taken off diuretics. He was prescribed oral potassium for a week and had his potassium levels checked a few days ago, which were found to be slightly low.    The patient has stage IV chronic kidney disease. He had a consultation with Dr. Durell on 10/25/2023, who ordered lab work including a lipid panel. An arterial scan  of his kidneys revealed some blockage, but it was not severe enough to warrant surgical intervention. Instead, medication was recommended.    He is currently on diuretics.    I have carefully reviewed all available medical records, previous office notes, lab, x-ray and procedure reports    Past Medical History:   Diagnosis Date    Atrial fibrillation (HCC)     Coronary artery disease involving native coronary artery of native heart without angina pectoris     Dyslipidemia     Primary hypertension     Prostate cancer (HCC)     S/P CABG (coronary artery bypass graft)         History reviewed. No pertinent surgical history.     No Known Allergies     Current Outpatient Medications   Medication Sig Dispense Refill    cycloSPORINE (RESTASIS) 0.05 % ophthalmic emulsion 1gtt BID OU      NIFEdipine (ADALAT CC) 30 MG extended release tablet 1 tablet on an empty stomach Orally Once a day; Duration: 90 days      bumetanide (BUMEX) 2 MG tablet Take 1 tablet by mouth Mon./Wed. /And Fri      amLODIPine  (NORVASC ) 10 MG tablet Take 1 tablet by mouth daily 90 tablet 3    TYRVAYA 0.03 MG/ACT SOLN       apixaban  (ELIQUIS ) 2.5 MG TABS tablet Take 1 tablet by mouth 2 times daily 60 tablet 11    tamsulosin  (FLOMAX ) 0.4 MG capsule Take 1 capsule by mouth daily  90 capsule 1    Omega-3 Fatty Acids (FISH OIL) 500 MG CAPS 500 mg daily      Calcium Carb-Cholecalciferol (CALCIUM 1000 + D) 1000-20 MG-MCG TABS daily      aspirin 81 MG EC tablet Take by mouth daily      atorvastatin (LIPITOR) 80 MG tablet 1 tablet      metOLazone  (ZAROXOLYN ) 5 MG tablet Take 1 tablet by mouth daily (Patient not taking: Reported on 11/04/2023) 30 tablet 11    bumetanide (BUMEX) 1 MG tablet Take 1 tablet by mouth daily       No current facility-administered medications for this visit.        Social History     Tobacco Use    Smoking status: Never     Passive exposure: Never    Smokeless tobacco: Never   Vaping Use    Vaping status: Never Used   Substance Use Topics     Alcohol use: Yes     Alcohol/week: 1.0 standard drink of alcohol     Types: 1 Glasses of wine per week    Drug use: Never        Family History   Problem Relation Age of Onset    Heart Attack Mother     Heart Attack Father     Heart Disease Brother         Review of Systems   Constitutional: Negative.    HENT: Negative.     Eyes: Negative.    Respiratory:  Positive for shortness of breath.    Cardiovascular:  Positive for chest pain and palpitations.   Gastrointestinal: Negative.    Endocrine: Negative.    Genitourinary: Negative.    Allergic/Immunologic: Negative.    Neurological: Negative.    Hematological: Negative.    Psychiatric/Behavioral: Negative.         Physical Exam  General: Well developed, patient in no acute distress.  HEENT: Pupils equal, round, reactive to light. Accommodation and extraocular movements are full. Sclerae are clear.  Neck: Supple with no jugular venous distention or carotid bruits. Trachea is midline with no thyromegaly.  Heart: Normal S1 and S2. Irregular rhythm with a 2/6 systolic murmur at the left lower sternal border.  Lungs: Clear to auscultation.  Abdomen: Soft, nondistended, nontender with active bowel sounds. No masses or organomegaly noted.  Extremities: No signs of clubbing. 1-2+ edema in the left leg, trace to 1+ on the right leg. Pulses are 2+ in upper extremities, 1+ in lower extremities.      ASSESSMENT/PLAN:   Assessment & Plan  1.  Paroxysmal atrial fibrillation:  Perform an EKG to reassess the condition. Discuss the results and next steps after the EKG. Consider placing a heart monitor for 7 days to evaluate heart rhythm. Discuss potential treatment options to manage atrial fibrillation. Educate on the impact of atrial fibrillation on blood pressure readings and the importance of accurate monitoring. Discuss risks and benefits of potential treatments for atrial fibrillation.    2. Hypokalemia:  Arrange a consultation with the nephrologist to devise a strategy for  potassium replacement. Discuss the importance of regular potassium supplementation to counteract losses from diuretics. Educate on the signs and symptoms of high and low potassium levels. Review recent lab results and adjust potassium supplementation accordingly. Discuss the importance of regular lab work to monitor potassium levels.    3. Chronic kidney disease, stage IV:  Discuss options with Dr. Durell regarding the management of chronic kidney disease. Educate on  the importance of fluid management and the role of diuretics. Discuss the need for regular monitoring of kidney function and related lab work. Review the impact of kidney disease on overall health and the importance of medication adherence.    4. Hypertension:  Confirm that hypertension is currently under control. Continue current management plan for hypertension. Educate on the importance of regular blood pressure monitoring and adherence to prescribed medications. Discuss lifestyle modifications to support blood pressure control, including diet and exercise.    Results  Labs   - Potassium level: Slightly low   - Kidney function test: Improved since April    1. Paroxysmal atrial fibrillation (HCC)  2. Exertional shortness of breath  3. Chronic diastolic (congestive) heart failure (HCC)  4. Bilateral leg edema  5. Primary hypertension  6. Mitral valve disease  7. Occlusion of right carotid artery  8. Coronary artery disease involving coronary bypass graft of native heart without angina pectoris  9. Abnormal ECG  10. Pulmonary hypertension (HCC)  11. Chronic kidney disease, stage 4 (severe) (N18.4)  12. Palpitations  -     Cardiac event/MCOT monitor; Future    No results found for any visits on 11/04/23.     Return in about 2 weeks (around 11/18/2023) for follow up with PA Roddie with f/u in Jan 2026 as scheduled.    On this date 11/04/2023 I have spent 51 minutes reviewing previous notes, test results and face to face with the patient discussing the  diagnosis and importance of compliance with the treatment plan as well as documenting on the day of the visit.    The patient (or guardian, if applicable) and other individuals in attendance with the patient were advised that Artificial Intelligence will be utilized during this visit to record, process the conversation to generate a clinical note and to support improvement of the AI technology. The patient (or guardian, if applicable) and other individuals in attendance at the appointment consented to the use of AI, including the recording.       An electronic signature was used to authenticate this note.    --Francis Qualia, MD

## 2023-11-04 NOTE — Progress Notes (Signed)
 1. "Have you been to the ER, urgent care clinic since your last visit?  Hospitalized since your last visit?" Reviewed by Dr. Gurney Maxin     2. "Have you seen or consulted any other health care providers outside of the [facility] since your last visit?" Reviewed by Dr. Gurney Maxin

## 2023-11-14 NOTE — Telephone Encounter (Signed)
"  Pt is asking for MRI ABD done at [facility]   "

## 2023-11-17 NOTE — Telephone Encounter (Signed)
"  FAXED MRI AB ORDER TO [facility] [phone]  "

## 2023-11-19 ENCOUNTER — Encounter

## 2023-11-19 LAB — EXTENDED CARDIAC HOLTER MONITOR: Body Surface Area: 1.91 m2

## 2023-12-01 ENCOUNTER — Ambulatory Visit: Admit: 2023-12-01 | Discharge: 2023-12-01 | Payer: PRIVATE HEALTH INSURANCE | Primary: Family

## 2023-12-01 VITALS — BP 166/80 | HR 63 | Ht 69.0 in | Wt 167.8 lb

## 2023-12-01 DIAGNOSIS — R002 Palpitations: Principal | ICD-10-CM

## 2023-12-01 NOTE — Progress Notes (Signed)
 "  John Chaney (DOB:  28-Sep-1940) is a 83 y.o. male, with history significant for paroxysmal afib on eliquis , orthostatic hypotension, hypokalemia, CKD, HTN, CAD, right carotid artery occlusion, abnormal EKG who presents for monitor results.     John Chaney states that he has overall been feeling well since his last appointment.  No chest discomfort or shortness of breath.  He states that he brought his blood pressure cuff in today to check the accuracy.  He previously had some issues with dizziness but this has improved.  He has been seeing a nephrologist as well for his kidney disease.  He reported 2 episodes of palpitations while wearing his monitor.    Relevant results  Monitor - Start date 11/04/2023; recording length 6d 23h.   The main rhythm was sinus with a minimum heart rate of 55 bpm (at Day 7 / 20:20:02), maximum heart rate of 97 bpm (at Day 3 / 08:00:53), and an average heart rate of 66 bpm.   Total of 88873 SVT events. The longest SVT run was 10.437 s at Day 8 / 00:18:33. The fastest SVT run was 164 bpm at Day 7 / 13:39.   PVC total: 57 (<0.01 %). 0 couplets.   PAC total: 232305 (31.23 %). 21209 couplets.   Total of 2 Patient-Triggered events.  Follow impression:  Multiple PACs encompassing 31%  Multiple nonsustained supraventricular tachycardia runs longest being 10 seconds.  Mild bradycardia noted  Minimal asymptomatic    Renal PVL 10/15/2023 -   Conclusions: Technically compromised study as described.   No evidence for hemodynamically significant (>60%) stenosis identified in the right renal artery.   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   Hypoechoic fluid collection identified in the bilateral kidney's as described above.     Bilateral lower extremity PVL, ABI 09/12/2023 -   Conclusions: Normal lower extremity arterial waveforms and pressures by this resting exam.     Echo 05/27/2023 - CONCLUSIONS    * Limited transthoracic echocardiogram.     * Left ventricular systolic function  is normal with an ejection fraction of 55 % by visual estimation.     * Left ventricular chamber size is normal.     * Mild concentric left ventricular hypertrophy.     * Left atrial chamber is enlarged by visual assessment .     * Right ventricular systolic function is abnormal with tricuspid annular plane systolic excursion (TAPSE) measuring 1.6 cm. TAPSV 6.8 cm/s.     * There is no aortic valve stenosis with a peak velocity of 1.8 m/s, mean gradient of 7 mmHg, and aortic valve area of 1.81 cm2. The non coronary cusp appears immobile.     * There is mild mitral valve regurgitation.     * There is mild tricuspid valve regurgitation.     * Dilated inferior vena cava diameter with <50% collapse upon inspiration consistent with elevated right atrial pressure, 15 mmHg.     * Mild pulmonary hypertension, estimated pulmonary arterial systolic pressure is 47 mmHg.     * For the indication of Heart Failure:Re-eval with clinical change (not due to meds/diet compliance), findings are LVEF 55%.   Comparison    * Compared to prior study from 05/05/2023 - Ejection fraction 50%.     Subjective   SUBJECTIVE/OBJECTIVE:  HPI  See above    Past Medical History:   Diagnosis Date    Atrial fibrillation (HCC)     Coronary artery disease involving native coronary artery of  native heart without angina pectoris     Dyslipidemia     Primary hypertension     Prostate cancer (HCC)     S/P CABG (coronary artery bypass graft)         History reviewed. No pertinent surgical history.     No Known Allergies     Current Outpatient Medications   Medication Sig Dispense Refill    losartan (COZAAR) 100 MG tablet Take 1 tablet by mouth daily      cycloSPORINE (RESTASIS) 0.05 % ophthalmic emulsion 1gtt BID OU      bumetanide (BUMEX) 2 MG tablet Take 1 tablet by mouth Mon./Wed. /And Fri      amLODIPine  (NORVASC ) 10 MG tablet Take 1 tablet by mouth daily 90 tablet 3    TYRVAYA 0.03 MG/ACT SOLN       apixaban  (ELIQUIS ) 2.5 MG TABS tablet Take 1 tablet by  mouth 2 times daily 60 tablet 11    bumetanide (BUMEX) 1 MG tablet Take 1 tablet by mouth daily      tamsulosin  (FLOMAX ) 0.4 MG capsule Take 1 capsule by mouth daily 90 capsule 1    Omega-3 Fatty Acids (FISH OIL) 500 MG CAPS 500 mg daily      Calcium Carb-Cholecalciferol (CALCIUM 1000 + D) 1000-20 MG-MCG TABS daily      aspirin 81 MG EC tablet Take by mouth daily      atorvastatin (LIPITOR) 80 MG tablet 1 tablet       No current facility-administered medications for this visit.        Social History     Tobacco Use    Smoking status: Never     Passive exposure: Never    Smokeless tobacco: Never   Vaping Use    Vaping status: Never Used   Substance Use Topics    Alcohol use: Yes     Alcohol/week: 1.0 standard drink of alcohol     Types: 1 Glasses of wine per week    Drug use: Never        Family History   Problem Relation Age of Onset    Heart Attack Mother     Heart Attack Father     Heart Disease Brother         Review of Systems   Constitutional:  Negative for activity change, appetite change, chills, diaphoresis, fatigue and fever.   HENT:  Negative for congestion, rhinorrhea and sore throat.    Eyes:  Negative for visual disturbance.   Respiratory:  Negative for cough, shortness of breath and wheezing.    Cardiovascular:  Positive for palpitations (2 episodes while wearing monitor but not significantly bothersome). Negative for chest pain and leg swelling.   Gastrointestinal:  Negative for abdominal pain, diarrhea, nausea and vomiting.   Genitourinary:  Negative for flank pain.   Musculoskeletal:  Negative for myalgias.   Skin:  Negative for rash and wound.   Neurological:  Negative for dizziness, syncope, light-headedness and headaches.   Psychiatric/Behavioral:  Negative for agitation and confusion. The patient is not nervous/anxious.        Physical Exam  HENT:      Head: Normocephalic and atraumatic.      Mouth/Throat:      Mouth: Mucous membranes are moist.   Eyes:      Extraocular Movements: Extraocular  movements intact.   Cardiovascular:      Rate and Rhythm: Normal rate and regular rhythm.      Pulses: Normal pulses.  Heart sounds: Murmur heard.      No friction rub. No gallop.   Pulmonary:      Effort: Pulmonary effort is normal. No respiratory distress.      Breath sounds: Normal breath sounds. No stridor. No rhonchi or rales.   Abdominal:      General: There is no distension.      Palpations: Abdomen is soft.      Tenderness: There is no abdominal tenderness. There is no guarding or rebound.   Musculoskeletal:      Right lower leg: No edema.      Left lower leg: No edema.   Skin:     General: Skin is warm and dry.   Neurological:      Mental Status: He is alert and oriented to person, place, and time.   Psychiatric:         Mood and Affect: Mood normal.         Behavior: Behavior normal.           ASSESSMENT/PLAN:  1. Palpitations  -No change in regimen at this time.  Per chart review, metoprolol was previously discontinued for bradycardia.  He will remain off of this at this time.  Comments:  Multiple PACs on recent monitor but asymptomatic.  Patient triggered events, heart racing, were during normal sinus rhythm.  Intermittent bradycardia    2. PAC (premature atrial contraction)    3. Paroxysmal atrial fibrillation (HCC)  -No recurrence of A-fib on recent monitor.  Continue Eliquis   -Recent thyroid levels were within normal limits    4. Primary hypertension  -Elevated today but he forgot to take his medications.  Blood pressure looks very variable at home as low was 110s and as high as the 140s.  Continue current medications at this time but recommend low-sodium diet and increase level of exercise    No results found for any visits on 12/01/23.     Appointment 02/27/2024 with Dr. Melida              An electronic signature was used to authenticate this note.    --Tinnie Boehringer, PA-C   "

## 2023-12-01 NOTE — Progress Notes (Signed)
"  1. Have you been to the ER, urgent care clinic since your last visit?  Hospitalized since your last visit? Reviewed by L. Roddie, PA    2. Have you seen or consulted any other health care providers outside of the [facility] since your last visit? Reviewed by L. Roddie, [place]   "

## 2023-12-02 ENCOUNTER — Encounter

## 2023-12-03 NOTE — Result Encounter Note (Signed)
"  Noted. No concerning renal mass - follow up as scheduled to review in detail.     Lacinda Rad, PA-C,   [facility]     "

## 2023-12-30 ENCOUNTER — Ambulatory Visit: Admit: 2023-12-30 | Discharge: 2023-12-30 | Payer: MEDICARE | Primary: Family

## 2023-12-30 VITALS — Ht 69.0 in | Wt 166.0 lb

## 2023-12-30 DIAGNOSIS — N401 Enlarged prostate with lower urinary tract symptoms: Principal | ICD-10-CM

## 2023-12-30 MED ORDER — TAMSULOSIN HCL 0.4 MG PO CAPS
0.4 | ORAL_CAPSULE | Freq: Every day | ORAL | 3 refills | 30.00000 days | Status: AC
Start: 2023-12-30 — End: ?

## 2023-12-30 NOTE — Progress Notes (Signed)
 "    My supervising physician today is Dr. Gwendlyn    This patient is being followed long term for the care of the conditions outlined below.     ASSESSMENT:     ICD-10-CM    1. Benign localized prostatic hyperplasia with lower urinary tract symptoms (LUTS)  N40.1 tamsulosin  (FLOMAX ) 0.4 MG capsule      2. History of prostate cancer  Z85.46       3. 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  Pretreatment PSA of 67.3.      Treated with IMRT + 2 years of ADT.  Completed IMRT 04/20/2018.    Treated in [place], [place].      Most recent PSA 0.19 ng/ml on 06/02/23 (T 538)    -Bilateral likely small renal cysts including suspected hemorrhagic/proteinaceous cysts on MRI Lumbar spine wo contrast 09/12/23   RUS 09/05/23: Right 1.5 cm simple cyst upper pole. Left 3.2 cm cyst with internal echogenic material presumably blood products, increased compared to 3.0 cm previously.    MRI Abd 11/27/23:  Incidental left 4.1 x 3.7 cm renal lesion of concern discussed on CT best corresponds to a debris-containing Bosniak class II hemorrhagic cyst. 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 There are other small Bosniak class I and Bosniak class II bilateral renal cysts.   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 male who is seen  in follow-up of prostate cancer and renal cyst.    Pt arrives today doing well, here to review most recent imaging. No new complaints.      In regards to voiding, he reports no bothersome urinary symptoms today.  He continues on Flomax  0.4mg  daily. Denies any gross hematuria or dysuria. Asymptomatic for UTI. No f/c/n/v.      Expresses concern today about increase in weight of 3 lbs- he tracks daily weights as encouraged by cardiology. He has follow up with cardiology in 4 days. Denies any chest pain or shortness of breath.      Past Medical History:   Diagnosis Date    Atrial fibrillation [facility])     Coronary artery disease involving native coronary artery of native heart without angina pectoris     Dyslipidemia     Primary hypertension     Prostate cancer (HCC)  S/P CABG (coronary artery bypass graft)        History reviewed. No pertinent surgical history.    Social History     Tobacco Use    Smoking status: Never     Passive exposure: Never    Smokeless tobacco: Never   Vaping Use    Vaping status: Never Used   Substance Use Topics    Alcohol use: Yes     Alcohol/week: 1.0 standard drink of alcohol     Types: 1 Glasses of wine per week    Drug use: Never       No Known Allergies    Family History   Problem Relation Age of Onset    Heart Attack Mother     Heart Attack Father     Heart Disease Brother        Current Outpatient Medications   Medication Sig Dispense Refill    tamsulosin  (FLOMAX ) 0.4 MG capsule Take 1 capsule by mouth daily 90 capsule 3    losartan (COZAAR) 100 MG tablet Take 1 tablet by mouth daily      cycloSPORINE (RESTASIS) 0.05 % ophthalmic emulsion 1gtt BID OU      bumetanide (BUMEX) 2 MG tablet Take 1 tablet by mouth Mon./Wed. /And Fri      amLODIPine  (NORVASC ) 10 MG tablet Take 1 tablet by mouth daily 90 tablet 3    TYRVAYA 0.03 MG/ACT SOLN       apixaban  (ELIQUIS )  2.5 MG TABS tablet Take 1 tablet by mouth 2 times daily 60 tablet 11    bumetanide (BUMEX) 1 MG tablet Take 1 tablet by mouth daily      Omega-3 Fatty Acids (FISH OIL) 500 MG CAPS 500 mg daily      Calcium Carb-Cholecalciferol (CALCIUM 1000 + D) 1000-20 MG-MCG TABS daily      aspirin 81 MG EC tablet Take by mouth daily      atorvastatin (LIPITOR) 80 MG tablet 1 tablet       No current facility-administered medications for this visit.         PHYSICAL EXAMINATION:   Ht 1.753 m (5' 9)   Wt 75.3 kg (166 lb)   BMI 24.51 kg/m   Constitutional: WDWN, Pleasant and appropriate affect, No acute distress.    CV:  No peripheral swelling noted  Respiratory: No respiratory distress or difficulties  Abdomen: Non-distended  Skin: No jaundice.    Neuro/Psych:  Alert and oriented x 3, affect appropriate.   Msk: FROM      REVIEW OF LABS AND IMAGING:      Lab Results   Component Value Date/Time    PSA 0.19 06/02/2023 09:19 AM    PSA 0.14 05/03/2021 02:01 PM    PSA <0.1 10/25/2020 12:58 PM    PSA <0.014 04/24/2020 12:00 AM     MRI Abd 11/27/23:  IMPRESSION   1.        Incidental left renal lesion of concern discussed on CT best corresponds to a debris-containing Bosniak class II hemorrhagic cyst. There are other small Bosniak class I and Bosniak class II bilateral renal cysts. These do not require further follow-up although assessment can be limited in the absence of IV contrast.   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 Recommend imaging follow-up with contrast-enhanced MRI or pancreas-protocol CT every 2 years for 4 years, depending on patient co-morbidities and preferences.     No results found for this visit on 12/30/23.  Espiridion Glatter, NP-C  [facility]   "

## 2024-01-05 ENCOUNTER — Encounter: Primary: Family

## 2024-02-27 ENCOUNTER — Ambulatory Visit
Admit: 2024-02-27 | Discharge: 2024-02-27 | Payer: PRIVATE HEALTH INSURANCE | Attending: Cardiovascular Disease | Primary: Family

## 2024-02-27 VITALS — BP 146/87 | HR 68 | Ht 69.0 in | Wt 163.2 lb

## 2024-02-27 DIAGNOSIS — I480 Paroxysmal atrial fibrillation: Principal | ICD-10-CM

## 2024-02-27 NOTE — Progress Notes (Signed)
 1. "Have you been to the ER, urgent care clinic since your last visit?  Hospitalized since your last visit?" Reviewed by Dr. Francis Qualia    2. "Have you seen or consulted any other health care providers outside of the [facility] since your last visit?" Reviewed by Dr. Francis Qualia

## 2024-02-27 NOTE — Progress Notes (Signed)
 John Chaney (DOB:  Jun 13, 1940) is a 84 y.o. male,Established patient, here for evaluation of the following chief complaint(s):  Follow-up (4months)      Subjective   SUBJECTIVE/OBJECTIVE:    History of Present Illness  Patient presents today for follow-up.  He has a history significant for paroxysmal afib on eliquis , orthostatic hypotension, hypokalemia, chronic kidney disease, hypertension, coronary artery disease without angina, right carotid artery occlusion, and an abnormal EKG     He experienced episodes of tachycardia in 11/2023, each lasting between 5 to 10 seconds, but these were not deemed significant enough to warrant immediate intervention. Currently, he does not report any symptoms of palpitations.    He was hospitalized from 01/24/2024 to 02/04/2024 due to acute metabolic encephalopathy secondary to acute kidney injury. His memory appears to be intact, and he reports no discomfort or pain. He has been on dialysis since then and underwent rehabilitation until 02/13/2024. He has been receiving dialysis three times a week at [facility] on [address] since his discharge on 02/13/2024.    He monitors his blood pressure twice daily, which typically ranges from 110/75 to 148/100. He also keeps track of his weight. He engages in physical therapy twice a week and utilizes a sitting elliptical machine in his condominium's gym. He also walks in the hallways for exercise.    SOCIAL HISTORY  Exercise: Physical therapy twice a week, uses a sitting elliptical machine, walks in the hallways.    I have carefully reviewed all available medical records, previous office notes, lab, x-ray and procedure reports    Past Medical History:   Diagnosis Date    Atrial fibrillation (HCC)     Coronary artery disease involving native coronary artery of native heart without angina pectoris     Dyslipidemia     Primary hypertension     Prostate cancer (HCC)     S/P CABG (coronary artery bypass graft)         History reviewed. No  pertinent surgical history.     No Known Allergies     Current Outpatient Medications   Medication Sig Dispense Refill    tamsulosin  (FLOMAX ) 0.4 MG capsule Take 1 capsule by mouth daily 90 capsule 3    amLODIPine  (NORVASC ) 10 MG tablet Take 1 tablet by mouth daily 90 tablet 3    TYRVAYA 0.03 MG/ACT SOLN       apixaban  (ELIQUIS ) 2.5 MG TABS tablet Take 1 tablet by mouth 2 times daily 60 tablet 11    Omega-3 Fatty Acids (FISH OIL) 500 MG CAPS 500 mg daily      Calcium Carb-Cholecalciferol (CALCIUM 1000 + D) 1000-20 MG-MCG TABS daily      aspirin 81 MG EC tablet Take by mouth daily      atorvastatin (LIPITOR) 80 MG tablet 1 tablet      losartan (COZAAR) 100 MG tablet Take 1 tablet by mouth daily (Patient not taking: Reported on 02/27/2024)      cycloSPORINE (RESTASIS) 0.05 % ophthalmic emulsion 1gtt BID OU (Patient not taking: Reported on 02/27/2024)      bumetanide (BUMEX) 2 MG tablet Take 1 tablet by mouth Mon./Wed. /And Fri (Patient not taking: Reported on 02/27/2024)      bumetanide (BUMEX) 1 MG tablet Take 1 tablet by mouth daily (Patient not taking: Reported on 02/27/2024)       No current facility-administered medications for this visit.        Social History     Tobacco Use  Smoking status: Never     Passive exposure: Never    Smokeless tobacco: Never   Vaping Use    Vaping status: Never Used   Substance Use Topics    Alcohol use: Yes     Alcohol/week: 1.0 standard drink of alcohol     Types: 1 Glasses of wine per week    Drug use: Never        Family History   Problem Relation Age of Onset    Heart Attack Mother     Heart Attack Father     Heart Disease Brother         Review of Systems   Constitutional: Negative.    HENT: Negative.     Eyes: Negative.    Respiratory:  Positive for shortness of breath.    Cardiovascular:  Positive for chest pain and palpitations.   Gastrointestinal: Negative.    Endocrine: Negative.    Genitourinary: Negative.    Allergic/Immunologic: Negative.    Neurological: Negative.     Hematological: Negative.    Psychiatric/Behavioral: Negative.         Physical Exam  Blood Pressure: Running a little high today  General: Well-developed patient in no acute distress  HEENT: Eyes: Pupils equal, round, reactive to light. Accommodation and extraocular movements are full. Sclerae clear.  Neck: Supple with no jugular venous distention. No carotid bruits. Trachea midline. No thyromegaly.  Heart: Normal S1 and S2. Regular rate and rhythm. 1/6 systolic murmur at the apex. S4 gallop noted.  Lungs: Clear to auscultation  Abdomen: Soft, nondistended, nontender. Bowel sounds present. No masses noted.  Extremities: No cyanosis, clubbing, or edema. Pulses are 2+ in upper and 1+ in lower extremities bilaterally.      ASSESSMENT/PLAN:   Assessment & Plan  1. Carotid artery stenosis:  Modest blockage in the right carotid artery. Re-evaluation planned for mid-summer. No immediate intervention required.    2. Acute metabolic encephalopathy:  Hospitalized from 01/24/2024 to 02/04/2024 due to acute metabolic encephalopathy caused by acute kidney injury. Currently on dialysis three times a week. [place] reported to be doing well. No further action required at this time.    3. Hypertension:  Blood pressure readings vary from 110/75 to 148/100. Advised to continue monitoring blood pressure twice daily and correlate higher readings with dialysis schedule. Physical activity, including walking and using gym facilities, encouraged to help manage blood pressure. No changes to current medication regimen necessary at this time.    Follow-up: Mid-summer.    Results  Heart monitor result 10/25 multiple nonsustained SVT runs the longest being 10 seconds.  Bradycardia is noted with an average heart rate of 66 bpm.  Lowest heart rate 55 bpm highest 97 bpm.  Multiple PACs noted.  This encompassed 31% of the beats.    Carotid Doppler study 06/25 moderate disease on the right internal carotid 50 to 69%.    1. Paroxysmal atrial  fibrillation (HCC)  2. Chronic diastolic (congestive) heart failure (HCC)  3. Primary hypertension  4. PAC (premature atrial contraction)  5. Exertional shortness of breath  6. Bilateral leg edema  7. Mitral valve disease  8. Occlusion of right carotid artery  9. Coronary artery disease involving coronary bypass graft of native heart without angina pectoris  10. Abnormal ECG  11. Pulmonary hypertension (HCC)  12. 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 Hypercholesteremia    No results found for any visits on 02/27/24.     Return in about 7 months (  around 09/26/2024) for Follow up with echo and carotid.    On this date 02/27/2024 I have spent 41 minutes reviewing previous notes, test results and face to face with the patient discussing the diagnosis and importance of compliance with the treatment plan as well as documenting on the day of the visit.    The patient (or guardian, if applicable) and other individuals in attendance with the patient were advised that Artificial Intelligence will be utilized during this visit to record, process the conversation to generate a clinical note and to support improvement of the AI technology. The patient (or guardian, if applicable) and other individuals in attendance at the appointment consented to the use of AI, including the recording.       An electronic signature was used to authenticate this note.    --Francis Qualia, MD

## 2024-03-24 ENCOUNTER — Encounter: Payer: Medicare (Managed Care) | Attending: Cardiovascular Disease | Primary: Family
